# Patient Record
Sex: Female | Born: 1960 | Race: Black or African American | Hispanic: No | Marital: Married | Smoking: Current some day smoker
Health system: Southern US, Community
[De-identification: ages and names within clinical notes are randomized; demographics above are authoritative.]

## PROBLEM LIST (undated history)

## (undated) DIAGNOSIS — E785 Hyperlipidemia, unspecified: Secondary | ICD-10-CM

## (undated) DIAGNOSIS — F410 Panic disorder [episodic paroxysmal anxiety] without agoraphobia: Secondary | ICD-10-CM

## (undated) DIAGNOSIS — E042 Nontoxic multinodular goiter: Secondary | ICD-10-CM

## (undated) DIAGNOSIS — E039 Hypothyroidism, unspecified: Secondary | ICD-10-CM

## (undated) DIAGNOSIS — Z720 Tobacco use: Secondary | ICD-10-CM

## (undated) DIAGNOSIS — I10 Essential (primary) hypertension: Secondary | ICD-10-CM

## (undated) DIAGNOSIS — E079 Disorder of thyroid, unspecified: Secondary | ICD-10-CM

## (undated) DIAGNOSIS — J449 Chronic obstructive pulmonary disease, unspecified: Secondary | ICD-10-CM

## (undated) DIAGNOSIS — E119 Type 2 diabetes mellitus without complications: Secondary | ICD-10-CM

## (undated) HISTORY — PX: TONSILLECTOMY: SUR1361

## (undated) HISTORY — PX: ABDOMINAL HYSTERECTOMY: SHX81

## (undated) HISTORY — PX: COLONOSCOPY: SHX174

---

## 2004-07-23 ENCOUNTER — Emergency Department: Payer: Self-pay | Admitting: Emergency Medicine

## 2005-03-11 ENCOUNTER — Ambulatory Visit: Payer: Self-pay | Admitting: Family Medicine

## 2007-03-03 ENCOUNTER — Ambulatory Visit: Payer: Self-pay | Admitting: Family Medicine

## 2009-08-14 ENCOUNTER — Ambulatory Visit: Payer: Self-pay | Admitting: Unknown Physician Specialty

## 2012-05-18 ENCOUNTER — Ambulatory Visit: Payer: Self-pay | Admitting: Family Medicine

## 2013-05-24 ENCOUNTER — Ambulatory Visit: Payer: Self-pay | Admitting: Family Medicine

## 2013-08-17 ENCOUNTER — Ambulatory Visit: Payer: Self-pay | Admitting: Gastroenterology

## 2013-08-20 LAB — PATHOLOGY REPORT

## 2013-11-15 DIAGNOSIS — I10 Essential (primary) hypertension: Secondary | ICD-10-CM | POA: Insufficient documentation

## 2014-05-31 ENCOUNTER — Ambulatory Visit: Payer: Self-pay | Admitting: Family Medicine

## 2015-03-18 ENCOUNTER — Emergency Department
Admission: EM | Admit: 2015-03-18 | Discharge: 2015-03-18 | Disposition: A | Discharge status: Home / Self Care | Payer: BLUE CROSS/BLUE SHIELD | Attending: Emergency Medicine | Admitting: Emergency Medicine

## 2015-03-18 ENCOUNTER — Emergency Department: Payer: BLUE CROSS/BLUE SHIELD

## 2015-03-18 ENCOUNTER — Encounter: Payer: Self-pay | Admitting: Emergency Medicine

## 2015-03-18 DIAGNOSIS — J45901 Unspecified asthma with (acute) exacerbation: Secondary | ICD-10-CM | POA: Insufficient documentation

## 2015-03-18 DIAGNOSIS — R0602 Shortness of breath: Secondary | ICD-10-CM | POA: Diagnosis present

## 2015-03-18 LAB — BASIC METABOLIC PANEL
ANION GAP: 5 (ref 5–15)
BUN: 14 mg/dL (ref 6–20)
CALCIUM: 9.4 mg/dL (ref 8.9–10.3)
CHLORIDE: 106 mmol/L (ref 101–111)
CO2: 27 mmol/L (ref 22–32)
CREATININE: 0.6 mg/dL (ref 0.44–1.00)
GFR calc non Af Amer: >60 mL/min (ref >60)
GLUCOSE: 147 mg/dL — AB (ref 65–99)
Potassium: 4.2 mmol/L (ref 3.5–5.1)
Sodium: 138 mmol/L (ref 135–145)

## 2015-03-18 LAB — CBC WITH DIFFERENTIAL/PLATELET
BASOS PCT: 1 %
Basophils Absolute: 0.1 10*3/uL (ref 0–0.1)
Eosinophils Absolute: 0.4 10*3/uL (ref 0–0.7)
Eosinophils Relative: 4 %
HEMATOCRIT: 41.1 % (ref 35.0–47.0)
HEMOGLOBIN: 13.7 g/dL (ref 12.0–16.0)
LYMPHS PCT: 52 %
Lymphs Abs: 5.2 10*3/uL — ABNORMAL HIGH (ref 1.0–3.6)
MCH: 31.7 pg (ref 26.0–34.0)
MCHC: 33.2 g/dL (ref 32.0–36.0)
MCV: 95.5 fL (ref 80.0–100.0)
MONO ABS: 0.7 10*3/uL (ref 0.2–0.9)
MONOS PCT: 7 %
NEUTROS ABS: 3.6 10*3/uL (ref 1.4–6.5)
NEUTROS PCT: 36 %
Platelets: 302 10*3/uL (ref 150–440)
RBC: 4.3 MIL/uL (ref 3.80–5.20)
RDW: 14.4 % (ref 11.5–14.5)
WBC: 9.9 10*3/uL (ref 3.6–11.0)

## 2015-03-18 LAB — TROPONIN I: Troponin I: <0.03 ng/mL (ref <0.031)

## 2015-03-18 MED ORDER — ALBUTEROL SULFATE HFA 108 (90 BASE) MCG/ACT IN AERS: 2.0000 | INHALATION_SPRAY | Freq: Four times a day (QID) | RESPIRATORY_TRACT | Status: DC | PRN

## 2015-03-18 MED ORDER — HYDROCOD POLST-CPM POLST ER 10-8 MG/5ML PO SUER: 5.0000 mL | Freq: Two times a day (BID) | ORAL | Status: DC

## 2015-03-18 MED ORDER — METHYLPREDNISOLONE SODIUM SUCC 125 MG IJ SOLR
125.0000 mg | Freq: Once | INTRAMUSCULAR | Status: AC
  Administered 2015-03-18: 125 mg via INTRAVENOUS
  Filled 2015-03-18: qty 2

## 2015-03-18 MED ORDER — ALBUTEROL SULFATE (2.5 MG/3ML) 0.083% IN NEBU
INHALATION_SOLUTION | RESPIRATORY_TRACT | Status: AC
  Administered 2015-03-18: 2.5 mg
  Filled 2015-03-18: qty 6

## 2015-03-18 MED ORDER — ALBUTEROL SULFATE (2.5 MG/3ML) 0.083% IN NEBU
5.0000 mg | INHALATION_SOLUTION | Freq: Once | RESPIRATORY_TRACT | Status: DC
  Filled 2015-03-18: qty 6

## 2015-03-18 MED ORDER — IPRATROPIUM-ALBUTEROL 0.5-2.5 (3) MG/3ML IN SOLN
3.0000 mL | Freq: Once | RESPIRATORY_TRACT | Status: AC
  Administered 2015-03-18: 3 mL via RESPIRATORY_TRACT
  Filled 2015-03-18: qty 3

## 2015-03-18 MED ORDER — METHYLPREDNISOLONE SODIUM SUCC 125 MG IJ SOLR: 125.0000 mg | Freq: Once | INTRAMUSCULAR | Status: DC

## 2015-03-18 MED ORDER — PREDNISONE 50 MG PO TABS: ORAL_TABLET | ORAL | Status: DC

## 2015-03-18 NOTE — ED Notes (Signed)
Patient transported to X-ray via stretcher by rad tech.

## 2015-03-18 NOTE — ED Notes (Signed)
Dr Williams at bedside 

## 2015-03-18 NOTE — ED Notes (Signed)
Pt. returned from XR. 

## 2015-03-18 NOTE — ED Provider Notes (Signed)
[facility] Emergency Department Provider Note     Time seen: ----------------------------------------- 7:54 PM on 03/18/2015 -----------------------------------------    I have reviewed the triage vital signs and the nursing notes.   HISTORY  Chief Complaint Shortness of Breath    HPI Caitlin Waters is a 54 y.o. female who presents ER for shortness of breath the last 2 days. Patient presents with audible wheezing, she reports chills and recent treatment for bronchitis. Patient is had chest x-ray yesterday which was normal. She states she has increasing shortness of breath with exertion and activity. She has not had significant cough.   No past medical history on file.  There are no active problems to display for this patient.   No past surgical history on file.  Allergies Codeine  Social History Social History  Substance Use Topics  . Smoking status: Not on file  . Smokeless tobacco: Not on file  . Alcohol Use: Not on file    Review of Systems Constitutional: Negative for fever. Eyes: Negative for visual changes. ENT: Negative for sore throat. Cardiovascular: Negative for chest pain. Respiratory: Positive for shortness of breath Gastrointestinal: Negative for abdominal pain, vomiting and diarrhea. Genitourinary: Negative for dysuria. Musculoskeletal: Negative for back pain. Skin: Negative for rash. Neurological: Negative for headaches, focal weakness or numbness.  10-point ROS otherwise negative.  ____________________________________________   PHYSICAL EXAM:  VITAL SIGNS: ED Triage Vitals  Enc Vitals Group     BP 03/18/15 1935 162/88 mmHg     Pulse Rate 03/18/15 1935 99     Resp 03/18/15 1935 26     Temp 03/18/15 1935 97.8 F (36.6 C)     Temp Source 03/18/15 1935 Oral     SpO2 03/18/15 1935 97 %     Weight 03/18/15 1935 140 lb (63.504 kg)     Height 03/18/15 1935 5\' 8"  (1.727 m)     Head Cir --      Peak Flow --    Pain Score --      Pain Loc --      Pain Edu? --      Excl. in GC? --     Constitutional: Alert and oriented. Well appearing and in no distress. Eyes: Conjunctivae are normal. PERRL. Normal extraocular movements. ENT   Head: Normocephalic and atraumatic.   Nose: No congestion/rhinnorhea.   Mouth/Throat: Mucous membranes are moist.   Neck: No stridor. Cardiovascular: Normal rate, regular rhythm. Normal and symmetric distal pulses are present in all extremities. No murmurs, rubs, or gallops. Respiratory: Normal respiratory effort without tachypnea, bilateral rhonchi. Gastrointestinal: Soft and nontender. No distention. No abdominal bruits.  Musculoskeletal: Nontender with normal range of motion in all extremities. No joint effusions.  No lower extremity tenderness nor edema. Neurologic:  Normal speech and language. No gross focal neurologic deficits are appreciated. Speech is normal. No gait instability. Skin:  Skin is warm, dry and intact. No rash noted. Psychiatric: Mood and affect are normal. Speech and behavior are normal. Patient exhibits appropriate insight and judgment. ____________________________________________  EKG: Interpreted by me. Normal sinus rhythm with normal axis normal intervals. No evidence of hypertrophy or acute infarction, rate is 97 bpm  ____________________________________________  ED COURSE:  Pertinent labs & imaging results that were available during my care of the patient were reviewed by me and considered in my medical decision making (see chart for details). Patient will receive nebs and steroids, will reevaluate. ____________________________________________    LABS (pertinent positives/negatives)  Labs Reviewed  CBC WITH DIFFERENTIAL/PLATELET - Abnormal; Notable for the following:    Lymphs Abs 5.2 (*)    All other components within normal limits  BASIC METABOLIC PANEL - Abnormal; Notable for the following:    Glucose, Bld 147 (*)     All other components within normal limits  TROPONIN I    RADIOLOGY Images were viewed by me  Chest x-ray  IMPRESSION: No acute cardiopulmonary process seen. ____________________________________________  FINAL ASSESSMENT AND PLAN  Acute asthma exacerbation  Plan: Patient with labs and imaging as dictated above. Patient likely with either URI or whether related to asthma exacerbation. She'll be discharged with steroids and given cough suppressant with close follow-up with her doctor as needed.   Emily Filbert, MD   Emily Filbert, MD 03/18/15 [phone]

## 2015-03-18 NOTE — Discharge Instructions (Signed)
Asthma, Adult Asthma is a recurring condition in which the airways tighten and narrow. Asthma can make it difficult to breathe. It can cause coughing, wheezing, and shortness of breath. Asthma episodes, also called asthma attacks, range from minor to life-threatening. Asthma cannot be cured, but medicines and lifestyle changes can help control it. CAUSES Asthma is believed to be caused by inherited (genetic) and environmental factors, but its exact cause is unknown. Asthma may be triggered by allergens, lung infections, or irritants in the air. Asthma triggers are different for each person. Common triggers include:   Animal dander.  Dust mites.  Cockroaches.  Pollen from trees or grass.  Mold.  Smoke.  Air pollutants such as dust, household cleaners, hair sprays, aerosol sprays, paint fumes, strong chemicals, or strong odors.  Cold air, weather changes, and winds (which increase molds and pollens in the air).  Strong emotional expressions such as crying or laughing hard.  Stress.  Certain medicines (such as aspirin) or types of drugs (such as beta-blockers).  Sulfites in foods and drinks. Foods and drinks that may contain sulfites include dried fruit, potato chips, and sparkling grape juice.  Infections or inflammatory conditions such as the flu, a cold, or an inflammation of the nasal membranes (rhinitis).  Gastroesophageal reflux disease (GERD).  Exercise or strenuous activity. SYMPTOMS Symptoms may occur immediately after asthma is triggered or many hours later. Symptoms include:  Wheezing.  Excessive nighttime or early morning coughing.  Frequent or severe coughing with a common cold.  Chest tightness.  Shortness of breath. DIAGNOSIS  The diagnosis of asthma is made by a review of your medical history and a physical exam. Tests may also be performed. These may include:  Lung function studies. These tests show how much air you breathe in and out.  Allergy  tests.  Imaging tests such as X-rays. TREATMENT  Asthma cannot be cured, but it can usually be controlled. Treatment involves identifying and avoiding your asthma triggers. It also involves medicines. There are 2 classes of medicine used for asthma treatment:   Controller medicines. These prevent asthma symptoms from occurring. They are usually taken every day.  Reliever or rescue medicines. These quickly relieve asthma symptoms. They are used as needed and provide short-term relief. Your health care provider will help you create an asthma action plan. An asthma action plan is a written plan for managing and treating your asthma attacks. It includes a list of your asthma triggers and how they may be avoided. It also includes information on when medicines should be taken and when their dosage should be changed. An action plan may also involve the use of a device called a peak flow meter. A peak flow meter measures how well the lungs are working. It helps you monitor your condition. HOME CARE INSTRUCTIONS   Take medicines only as directed by your health care provider. Speak with your health care provider if you have questions about how or when to take the medicines.  Use a peak flow meter as directed by your health care provider. Record and keep track of readings.  Understand and use the action plan to help minimize or stop an asthma attack without needing to seek medical care.  Control your home environment in the following ways to help prevent asthma attacks:  Do not smoke. Avoid being exposed to secondhand smoke.  Change your heating and air conditioning filter regularly.  Limit your use of fireplaces and wood stoves.  Get rid of pests (such as roaches   and mice) and their droppings.  Throw away plants if you see mold on them.  Clean your floors and dust regularly. Use unscented cleaning products.  Try to have someone else vacuum for you regularly. Stay out of rooms while they are  being vacuumed and for a short while afterward. If you vacuum, use a dust mask from a hardware store, a double-layered or microfilter vacuum cleaner bag, or a vacuum cleaner with a HEPA filter.  Replace carpet with wood, tile, or vinyl flooring. Carpet can trap dander and dust.  Use allergy-proof pillows, mattress covers, and box spring covers.  Wash bed sheets and blankets every week in hot water and dry them in a dryer.  Use blankets that are made of polyester or cotton.  Clean bathrooms and kitchens with bleach. If possible, have someone repaint the walls in these rooms with mold-resistant paint. Keep out of the rooms that are being cleaned and painted.  Wash hands frequently. SEEK MEDICAL CARE IF:   You have wheezing, shortness of breath, or a cough even if taking medicine to prevent attacks.  The colored mucus you cough up (sputum) is thicker than usual.  Your sputum changes from clear or white to yellow, green, gray, or bloody.  You have any problems that may be related to the medicines you are taking (such as a rash, itching, swelling, or trouble breathing).  You are using a reliever medicine more than 2-3 times per week.  Your peak flow is still at 50-79% of your personal best after following your action plan for 1 hour.  You have a fever. SEEK IMMEDIATE MEDICAL CARE IF:   You seem to be getting worse and are unresponsive to treatment during an asthma attack.  You are short of breath even at rest.  You get short of breath when doing very little physical activity.  You have difficulty eating, drinking, or talking due to asthma symptoms.  You develop chest pain.  You develop a fast heartbeat.  You have a bluish color to your lips or fingernails.  You are light-headed, dizzy, or faint.  Your peak flow is less than 50% of your personal best.   This information is not intended to replace advice given to you by your health care provider. Make sure you discuss any  questions you have with your health care provider.   Document Released: 04/22/2005 Document Revised: 01/11/2015 Document Reviewed: 11/19/2012 Elsevier Interactive Patient Education 2016 [employer]  

## 2015-03-18 NOTE — ED Notes (Signed)
Patient with complaint of shortness of breath times two days. Patient with audible wheezes. Patient reports chills.

## 2015-03-18 NOTE — ED Notes (Signed)
Pt in room w/ proventil nebulizing.  Breath sounds tight, pt in mild/moderate distress.  See VS flowsheet.

## 2015-04-25 ENCOUNTER — Other Ambulatory Visit: Payer: Self-pay | Admitting: Family Medicine

## 2015-04-25 DIAGNOSIS — Z1231 Encounter for screening mammogram for malignant neoplasm of breast: Secondary | ICD-10-CM

## 2015-05-05 ENCOUNTER — Emergency Department: Payer: BLUE CROSS/BLUE SHIELD

## 2015-05-05 ENCOUNTER — Encounter: Payer: Self-pay | Admitting: Emergency Medicine

## 2015-05-05 ENCOUNTER — Inpatient Hospital Stay
Admission: EM | Admit: 2015-05-05 | Discharge: 2015-05-05 | DRG: 190 | Disposition: A | Discharge status: Home / Self Care | Payer: BLUE CROSS/BLUE SHIELD | Attending: Internal Medicine | Admitting: Internal Medicine

## 2015-05-05 DIAGNOSIS — Z885 Allergy status to narcotic agent status: Secondary | ICD-10-CM | POA: Diagnosis not present

## 2015-05-05 DIAGNOSIS — I10 Essential (primary) hypertension: Secondary | ICD-10-CM | POA: Diagnosis present

## 2015-05-05 DIAGNOSIS — J9601 Acute respiratory failure with hypoxia: Secondary | ICD-10-CM | POA: Diagnosis present

## 2015-05-05 DIAGNOSIS — Z79899 Other long term (current) drug therapy: Secondary | ICD-10-CM | POA: Diagnosis not present

## 2015-05-05 DIAGNOSIS — E039 Hypothyroidism, unspecified: Secondary | ICD-10-CM | POA: Diagnosis present

## 2015-05-05 DIAGNOSIS — J441 Chronic obstructive pulmonary disease with (acute) exacerbation: Principal | ICD-10-CM | POA: Diagnosis present

## 2015-05-05 HISTORY — DX: Tobacco use: Z72.0

## 2015-05-05 HISTORY — DX: Disorder of thyroid, unspecified: E07.9

## 2015-05-05 HISTORY — DX: Chronic obstructive pulmonary disease, unspecified: J44.9

## 2015-05-05 HISTORY — DX: Essential (primary) hypertension: I10

## 2015-05-05 LAB — CBC
HEMATOCRIT: 40.5 % (ref 35.0–47.0)
Hemoglobin: 13.7 g/dL (ref 12.0–16.0)
MCH: 31.9 pg (ref 26.0–34.0)
MCHC: 33.8 g/dL (ref 32.0–36.0)
MCV: 94.5 fL (ref 80.0–100.0)
PLATELETS: 325 10*3/uL (ref 150–440)
RBC: 4.28 MIL/uL (ref 3.80–5.20)
RDW: 13.7 % (ref 11.5–14.5)
WBC: 12.4 10*3/uL — ABNORMAL HIGH (ref 3.6–11.0)

## 2015-05-05 LAB — BASIC METABOLIC PANEL
Anion gap: 10 (ref 5–15)
BUN: 11 mg/dL (ref 6–20)
CHLORIDE: 102 mmol/L (ref 101–111)
CO2: 28 mmol/L (ref 22–32)
CREATININE: 0.69 mg/dL (ref 0.44–1.00)
Calcium: 9.3 mg/dL (ref 8.9–10.3)
GFR calc Af Amer: >60 mL/min (ref >60)
GFR calc non Af Amer: >60 mL/min (ref >60)
GLUCOSE: 121 mg/dL — AB (ref 65–99)
POTASSIUM: 3.7 mmol/L (ref 3.5–5.1)
SODIUM: 140 mmol/L (ref 135–145)

## 2015-05-05 LAB — EXPECTORATED SPUTUM ASSESSMENT W REFEX TO RESP CULTURE: SPECIAL REQUESTS: NORMAL

## 2015-05-05 LAB — TROPONIN I: Troponin I: <0.03 ng/mL (ref <0.031)

## 2015-05-05 LAB — EXPECTORATED SPUTUM ASSESSMENT W GRAM STAIN, RFLX TO RESP C

## 2015-05-05 MED ORDER — IPRATROPIUM-ALBUTEROL 0.5-2.5 (3) MG/3ML IN SOLN
3.0000 mL | RESPIRATORY_TRACT | Status: DC
  Administered 2015-05-05 (×2): 3 mL via RESPIRATORY_TRACT
  Filled 2015-05-05 (×2): qty 3

## 2015-05-05 MED ORDER — NICOTINE 14 MG/24HR TD PT24
14.0000 mg | MEDICATED_PATCH | Freq: Every day | TRANSDERMAL | Status: DC
Start: 2015-05-05 — End: 2016-01-09

## 2015-05-05 MED ORDER — ACETAMINOPHEN 650 MG RE SUPP: 650.0000 mg | Freq: Four times a day (QID) | RECTAL | Status: DC | PRN

## 2015-05-05 MED ORDER — SODIUM CHLORIDE 0.9 % IJ SOLN
3.0000 mL | Freq: Two times a day (BID) | INTRAMUSCULAR | Status: DC
  Administered 2015-05-05: 3 mL via INTRAVENOUS

## 2015-05-05 MED ORDER — IPRATROPIUM-ALBUTEROL 0.5-2.5 (3) MG/3ML IN SOLN
RESPIRATORY_TRACT | Status: AC
  Administered 2015-05-05: 3 mL via RESPIRATORY_TRACT
  Filled 2015-05-05: qty 3

## 2015-05-05 MED ORDER — ONDANSETRON HCL 4 MG PO TABS: 4.0000 mg | ORAL_TABLET | Freq: Four times a day (QID) | ORAL | Status: DC | PRN

## 2015-05-05 MED ORDER — ALBUTEROL SULFATE (2.5 MG/3ML) 0.083% IN NEBU
5.0000 mg | INHALATION_SOLUTION | Freq: Once | RESPIRATORY_TRACT | Status: AC
  Administered 2015-05-05: 5 mg via RESPIRATORY_TRACT

## 2015-05-05 MED ORDER — LEVOFLOXACIN 750 MG PO TABS
750.0000 mg | ORAL_TABLET | Freq: Every day | ORAL | Status: DC
  Administered 2015-05-05: 750 mg via ORAL
  Filled 2015-05-05: qty 1

## 2015-05-05 MED ORDER — ASPIRIN EC 81 MG PO TBEC
81.0000 mg | DELAYED_RELEASE_TABLET | Freq: Every day | ORAL | Status: DC
  Filled 2015-05-05: qty 1

## 2015-05-05 MED ORDER — METHYLPREDNISOLONE SODIUM SUCC 125 MG IJ SOLR
60.0000 mg | Freq: Every day | INTRAMUSCULAR | Status: DC
  Administered 2015-05-05: 60 mg via INTRAVENOUS
  Filled 2015-05-05: qty 2

## 2015-05-05 MED ORDER — DOCUSATE SODIUM 100 MG PO CAPS
100.0000 mg | ORAL_CAPSULE | Freq: Two times a day (BID) | ORAL | Status: DC
  Administered 2015-05-05: 100 mg via ORAL
  Filled 2015-05-05: qty 1

## 2015-05-05 MED ORDER — PREDNISONE 10 MG PO TABS: 10.0000 mg | ORAL_TABLET | Freq: Every day | ORAL | Status: DC

## 2015-05-05 MED ORDER — ALBUTEROL SULFATE (2.5 MG/3ML) 0.083% IN NEBU
INHALATION_SOLUTION | RESPIRATORY_TRACT | Status: AC
  Administered 2015-05-05: 5 mg via RESPIRATORY_TRACT
  Filled 2015-05-05: qty 6

## 2015-05-05 MED ORDER — BENZONATATE 100 MG PO CAPS
200.0000 mg | ORAL_CAPSULE | Freq: Once | ORAL | Status: AC
  Administered 2015-05-05: 200 mg via ORAL
  Filled 2015-05-05: qty 2

## 2015-05-05 MED ORDER — POLYETHYLENE GLYCOL 3350 17 G PO PACK: 17.0000 g | PACK | Freq: Every day | ORAL | Status: DC | PRN

## 2015-05-05 MED ORDER — LEVOFLOXACIN 750 MG PO TABS: 750.0000 mg | ORAL_TABLET | Freq: Every day | ORAL | Status: DC

## 2015-05-05 MED ORDER — SODIUM CHLORIDE 0.9 % IJ SOLN: 3.0000 mL | INTRAMUSCULAR | Status: DC | PRN

## 2015-05-05 MED ORDER — LOSARTAN POTASSIUM 25 MG PO TABS
25.0000 mg | ORAL_TABLET | Freq: Every day | ORAL | Status: DC
  Administered 2015-05-05: 25 mg via ORAL
  Filled 2015-05-05: qty 1

## 2015-05-05 MED ORDER — SODIUM CHLORIDE 0.9 % IV SOLN: 250.0000 mL | INTRAVENOUS | Status: DC | PRN

## 2015-05-05 MED ORDER — ENOXAPARIN SODIUM 40 MG/0.4ML ~~LOC~~ SOLN: 40.0000 mg | SUBCUTANEOUS | Status: DC

## 2015-05-05 MED ORDER — IPRATROPIUM-ALBUTEROL 0.5-2.5 (3) MG/3ML IN SOLN
3.0000 mL | Freq: Once | RESPIRATORY_TRACT | Status: AC
Start: 2015-05-05 — End: 2015-05-05
  Administered 2015-05-05: 3 mL via RESPIRATORY_TRACT

## 2015-05-05 MED ORDER — ACETAMINOPHEN 325 MG PO TABS: 650.0000 mg | ORAL_TABLET | Freq: Four times a day (QID) | ORAL | Status: DC | PRN

## 2015-05-05 MED ORDER — ONDANSETRON HCL 4 MG/2ML IJ SOLN: 4.0000 mg | Freq: Four times a day (QID) | INTRAMUSCULAR | Status: DC | PRN

## 2015-05-05 MED ORDER — LEVOTHYROXINE SODIUM 100 MCG PO TABS
100.0000 ug | ORAL_TABLET | Freq: Every day | ORAL | Status: DC
Start: 2015-05-05 — End: 2015-05-05
  Administered 2015-05-05: 100 ug via ORAL
  Filled 2015-05-05: qty 1

## 2015-05-05 NOTE — ED Notes (Signed)
Pt placed on 2L o2 via [facility] per dr. Manson PasseyBrown request

## 2015-05-05 NOTE — Progress Notes (Addendum)
Pt is alert and oriented x 4, denies pain, ambulatory in room, on room air, denies SOB, no problems with breathing, denies chest pain and reports she is ready to be discharged,WBC elevated over night, pt is d/c to home, pt will continue on po prednisone and Levaquin, nicotine patch sent into pharmacy- pt reports she is committed to smoking cessation, f/u appt scheduled,  pt reports understanding of d/c instructions and has no further questions at this time. Pt d/c via husband, uneventful shift.

## 2015-05-05 NOTE — ED Notes (Signed)
Pt to rm 11 via EMS from home.  EMS report SOB x 1 day, but problems with SOB since October, pt has seen PCP w/ no clear cause.  Pt received 125 solumedrol and 2 duonebs in route.  Dr. Manson PasseyBrown at bedside.  Pt speaking in short sentences, breathing labored.

## 2015-05-05 NOTE — ED Notes (Signed)
Dr. Brown at bedside to discuss admission

## 2015-05-05 NOTE — Discharge Summary (Signed)
[facility] [place] [facility]   PATIENT NAME: Caitlin SanderWilma Abramovich    MR#:  [id number]  DATE OF BIRTH:  04/12/1961  DATE OF ADMISSION:  05/05/2015 ADMITTING PHYSICIAN: Milagros LollSrikar Sudini, MD  DATE OF DISCHARGE:*05/05/2015 11:12 AM  PRIMARY CARE PHYSICIAN: Marisue IvanLINTHAVONG, KANHKA, MD    ADMISSION DIAGNOSIS:  Breathing difficulty  DISCHARGE DIAGNOSIS:  Active Problems:   COPD exacerbation (HCC)   SECONDARY DIAGNOSIS:   Past Medical History  Diagnosis Date  . Thyroid disease   . HTN (hypertension)   . Tobacco abuse   . COPD (chronic obstructive pulmonary disease) [facility](HCC)     HOSPITAL COURSE:   54 year old female with a history of COPD and hypothyroidism who presented with shortness of breath and wheezing. For further details please refer the H&P.  1. Acute COPD exacerbation: Patient was admitted to the hospital service and treated for COPD with IV steroids and nebulizers. She is improved.   2. Acute hypoxic respiratory failure: This is secondary to problem #1. She was no longer hypoxic at discharge.  3. Essential hypertension: Continue losartan.  4.. Hypothyroid: Continue Synthroid.  5. Tobacco dependence: Patient was counseled on admission. Patient will be discharged with nicotine patch.  DISCHARGE CONDITIONS AND DIET:  Patient is stable for discharge on a heart healthy diet  CONSULTS OBTAINED:     DRUG ALLERGIES:   Allergies  Allergen Reactions  . Codeine Other (See Comments)    Was prescribed in past cough syrup with codeine that made her "feel funny."    DISCHARGE MEDICATIONS:   Discharge Medication List as of 05/05/2015 10:05 AM    START taking these medications   Details  levofloxacin (LEVAQUIN) 750 MG tablet Take 1 tablet (750 mg total) by mouth daily., Starting 05/05/2015, Until Discontinued, Normal    nicotine (NICODERM CQ) 14 mg/24hr patch Place 1 patch (14 mg total) onto the skin daily., Starting 05/05/2015, Until Discontinued,  Normal      CONTINUE these medications which have CHANGED   Details  predniSONE (DELTASONE) 10 MG tablet Take 1 tablet (10 mg total) by mouth daily with breakfast., Starting 05/05/2015, Until Discontinued, Print      CONTINUE these medications which have NOT CHANGED   Details  albuterol (PROVENTIL HFA;VENTOLIN HFA) 108 (90 BASE) MCG/ACT inhaler Inhale 2 puffs into the lungs every 6 (six) hours as needed for wheezing or shortness of breath., Starting 03/18/2015, Until Discontinued, Print    levothyroxine (SYNTHROID, LEVOTHROID) 100 MCG tablet Take 100 mcg by mouth daily before breakfast., Until Discontinued, Historical Med    losartan (COZAAR) 25 MG tablet Take 25 mg by mouth daily., Until Discontinued, Historical Med    chlorpheniramine-HYDROcodone (TUSSIONEX PENNKINETIC ER) 10-8 MG/5ML SUER Take 5 mLs by mouth 2 (two) times daily., Starting 03/18/2015, Until Discontinued, Print              Today   CHIEF COMPLAINT:  Patient is doing well this point. Patient is ready for discharge. She does not like her breakfast.   VITAL SIGNS:  Blood pressure 116/74, pulse 108, temperature 97.7 F (36.5 C), temperature source Oral, resp. rate 22, height 5\' 7"  (1.702 m), weight 63.64 kg (140 lb 4.8 oz), SpO2 96 %.   REVIEW OF SYSTEMS:  Review of Systems  Constitutional: Negative for fever, chills and malaise/fatigue.  HENT: Negative for sore throat.   Eyes: Negative for blurred vision.  Respiratory: Negative for cough, hemoptysis, shortness of breath and wheezing.   Cardiovascular: Negative for chest pain, palpitations and  leg swelling.  Gastrointestinal: Negative for nausea, vomiting, abdominal pain, diarrhea and blood in stool.  Genitourinary: Negative for dysuria.  Musculoskeletal: Negative for back pain.  Neurological: Negative for dizziness, tremors and headaches.  Endo/Heme/Allergies: Does not bruise/bleed easily.     PHYSICAL EXAMINATION:  GENERAL:  54 y.o.-year-old  patient lying in the bed with no acute distress.  NECK:  Supple, no jugular venous distention. No thyroid enlargement, no tenderness.  LUNGS: Normal breath sounds bilaterally, no wheezing, rales,rhonchi  No use of accessory muscles of respiration.  CARDIOVASCULAR: S1, S2 normal. No murmurs, rubs, or gallops.  ABDOMEN: Soft, non-tender, non-distended. Bowel sounds present. No organomegaly or mass.  EXTREMITIES: No pedal edema, cyanosis, or clubbing.  PSYCHIATRIC: The patient is alert and oriented x 3.  SKIN: No obvious rash, lesion, or ulcer.   DATA REVIEW:   CBC  Recent Labs Lab 05/05/15 0101  WBC 12.4*  HGB 13.7  HCT 40.5  PLT 325    Chemistries   Recent Labs Lab 05/05/15 0101  NA 140  K 3.7  CL 102  CO2 28  GLUCOSE 121*  BUN 11  CREATININE 0.69  CALCIUM 9.3    Cardiac Enzymes  Recent Labs Lab 05/05/15 0101  TROPONINI <0.03    Microbiology Results  @  RADIOLOGY:  Dg Chest Port 1 View  05/05/2015  CLINICAL DATA:  54 year old female with cough and shortness of breath EXAM: PORTABLE CHEST 1 VIEW COMPARISON:  Radiograph dated 03/18/2015 FINDINGS: The heart size and mediastinal contours are within normal limits. Both lungs are clear. The visualized skeletal structures are unremarkable. IMPRESSION: No active disease. Electronically Signed   By: Elgie Collard M.D.   On: 05/05/2015 01:29      Management plans discussed with the patient and she is in agreement. Stable for discharge home  Patient should follow up with PCP in one week  CODE STATUS:     Code Status Orders        Start     Ordered   05/05/15 0414  Full code   Continuous     05/05/15 0413      TOTAL TIME TAKING CARE OF THIS PATIENT: 35 minutes.    Note: This dictation was prepared with Dragon dictation along with smaller phrase technology. Any transcriptional errors that result from this process are unintentional.  Keidrick Murty M.D on 05/05/2015 at 12:49 PM  Between  7am to 6pm - Pager - [phone] After 6pm go to [email or web address] - password [email or web address] [facility]  [facility]  Office  [phone]  CC: Primary care physician; Marisue Ivan, MD

## 2015-05-05 NOTE — ED Provider Notes (Signed)
[facility] Emergency Department Provider Note  ____________________________________________  Time seen: 1:00 AM  I have reviewed the triage vital signs and the nursing notes.   HISTORY  Chief Complaint Shortness of Breath      HPI Caitlin Waters is a 54 y.o. female presents via EMS with complaint of dyspnea times one day that has been progressive. Patient states that she's had this since October but acutely worsened yesterday. Patient received 2 DuoNeb's and 125 mg of Solu-Medrol IV by EMS before presentation with improvement of symptoms.    Past Medical History  Diagnosis Date  . Thyroid disease     There are no active problems to display for this patient.   History reviewed. No pertinent past surgical history.  Current Outpatient Rx  Name  Route  Sig  Dispense  Refill  . albuterol (PROVENTIL HFA;VENTOLIN HFA) 108 (90 BASE) MCG/ACT inhaler   Inhalation   Inhale 2 puffs into the lungs every 6 (six) hours as needed for wheezing or shortness of breath.   1 Inhaler   2   . levothyroxine (SYNTHROID, LEVOTHROID) 100 MCG tablet   Oral   Take 100 mcg by mouth daily before breakfast.         . losartan (COZAAR) 25 MG tablet   Oral   Take 25 mg by mouth daily.         . chlorpheniramine-HYDROcodone (TUSSIONEX PENNKINETIC ER) 10-8 MG/5ML SUER   Oral   Take 5 mLs by mouth 2 (two) times daily.   140 mL   0   . predniSONE (DELTASONE) 50 MG tablet      Take one tablet by mouth daily   4 tablet   0     Allergies Codeine  History reviewed. No pertinent family history.  Social History Social History  Substance Use Topics  . Smoking status: Current Every Day Smoker -- 0.10 packs/day    Types: Cigarettes  . Smokeless tobacco: None  . Alcohol Use: Yes    Review of Systems  Constitutional: Negative for fever. Eyes: Negative for visual changes. ENT: Negative for sore throat. Cardiovascular: Negative for chest pain. Respiratory:  Positive for dyspnea Gastrointestinal: Negative for abdominal pain, vomiting and diarrhea. Genitourinary: Negative for dysuria. Musculoskeletal: Negative for back pain. Skin: Negative for rash. Neurological: Negative for headaches, focal weakness or numbness.   10-point ROS otherwise negative.  ____________________________________________   PHYSICAL EXAM:  VITAL SIGNS: ED Triage Vitals  Enc Vitals Group     BP 05/05/15 0055 141/63 mmHg     Pulse Rate 05/05/15 0055 97     Resp 05/05/15 0055 17     Temp 05/05/15 0055 97.5 F (36.4 C)     Temp Source 05/05/15 0055 Oral     SpO2 05/05/15 0055 97 %     Weight 05/05/15 0055 140 lb (63.504 kg)     Height 05/05/15 0055  (1.702 m)     Head Cir --      Peak Flow --      Pain Score 05/05/15 0056 0     Pain Loc --      Pain Edu? --      Excl. in GC? --      Constitutional: Alert and oriented. Apparent distress Eyes: Conjunctivae are normal. PERRL. Normal extraocular movements. ENT   Head: Normocephalic and atraumatic.   Nose: No congestion/rhinnorhea.   Mouth/Throat: Mucous membranes are moist.   Neck: No stridor. Hematological/Lymphatic/Immunilogical: No cervical lymphadenopathy. Cardiovascular: Normal  rate, regular rhythm. Normal and symmetric distal pulses are present in all extremities. No murmurs, rubs, or gallops. Respiratory: Tachypnea with accessory muscle use diffuse rhonchi on auscultation Gastrointestinal: Soft and nontender. No distention. There is no CVA tenderness. Genitourinary: deferred Musculoskeletal: Nontender with normal range of motion in all extremities. No joint effusions.  No lower extremity tenderness nor edema. Neurologic:  Normal speech and language. No gross focal neurologic deficits are appreciated. Speech is normal.  Skin:  Skin is warm, dry and intact. No rash noted. Psychiatric: Mood and affect are normal. Speech and behavior are normal. Patient exhibits appropriate insight and  judgment.  ____________________________________________    LABS (pertinent positives/negatives)  Labs Reviewed  BASIC METABOLIC PANEL - Abnormal; Notable for the following:    Glucose, Bld 121 (*)    All other components within normal limits  CBC - Abnormal; Notable for the following:    WBC 12.4 (*)    All other components within normal limits  TROPONIN I     ____________________________________________   EKG  ED ECG REPORT I, Czar Ysaguirre, [place], the attending physician, personally viewed and interpreted this ECG.   Date: 05/05/2015  EKG Time: 12:58 AM  Rate: 97  Rhythm: Normal sinus rhythm  Axis: None  Intervals: Normal  ST&T Change: None   ____________________________________________    RADIOLOGY    DG Chest Port 1 View (Final result) Result time: 05/05/15 01:29:14   Final result by Rad Results In Interface (05/05/15 01:29:14)   Narrative:   CLINICAL DATA: 54 year old female with cough and shortness of breath  EXAM: PORTABLE CHEST 1 VIEW  COMPARISON: Radiograph dated 03/18/2015  FINDINGS: The heart size and mediastinal contours are within normal limits. Both lungs are clear. The visualized skeletal structures are unremarkable.  IMPRESSION: No active disease.   Electronically Signed By: Elgie CollardArash Radparvar M.D. On: 05/05/2015 01:29        INITIAL IMPRESSION / ASSESSMENT AND PLAN / ED COURSE  Pertinent labs & imaging results that were available during my care of the patient were reviewed by me and considered in my medical decision making (see chart for details).  Patient received 2 DuoNeb's in the emergency department continued difficulty breathing and cough  ____________________________________________   FINAL CLINICAL IMPRESSION(S) / ED DIAGNOSES  Final diagnoses:  COPD exacerbation (HCC)      Darci Currentandolph N Jillyan Plitt, MD 05/07/15 [phone]

## 2015-05-05 NOTE — H&P (Signed)
[facility] [place] [facility]   PATIENT NAME: Caitlin SanderWilma Boyar    MR#:  [id number]  DATE OF BIRTH:  May 05, 1961  DATE OF ADMISSION:  05/05/2015  PRIMARY CARE PHYSICIAN: Marisue IvanLINTHAVONG, KANHKA, MD   REQUESTING/REFERRING PHYSICIAN: Dr. Manson PasseyBrown  CHIEF COMPLAINT:   Chief Complaint  Patient presents with  . Shortness of Breath    HISTORY OF PRESENT ILLNESS:  Caitlin Waters  is a 54 y.o. female with a known history of hypertension, hypothyroidism, tobacco abuse presents to the emergency room complaining of worsening shortness of breath and cough with wheezing. Her symptoms started 2 months back with on and off worsening. Was seen in the emergency room 1 month prior and treated with prednisone and albuterol inhaler with good symptoms improved. Symptoms started worsening again a few days prior with acute worsening today prompting her to come to the emergency room. Patient has had long-standing history of shortness of breath with exertion. She has 20-pack-year smoking history and likely underlying COPD. Continues to smoke in spite of her symptoms. No sick contacts. No orthopnea or edema.  PAST MEDICAL HISTORY:   Past Medical History  Diagnosis Date  . Thyroid disease   . HTN (hypertension)   . Tobacco abuse   . COPD (chronic obstructive pulmonary disease) (HCC)     PAST SURGICAL HISTORY:  History reviewed. No pertinent past surgical history.  SOCIAL HISTORY:   Social History  Substance Use Topics  . Smoking status: Current Every Day Smoker -- 0.10 packs/day    Types: Cigarettes  . Smokeless tobacco: Not on file  . Alcohol Use: 0.0 oz/week    0 Standard drinks or equivalent per week    FAMILY HISTORY:  History reviewed. No pertinent family history.  DRUG ALLERGIES:   Allergies  Allergen Reactions  . Codeine Other (See Comments)    Was prescribed in past cough syrup with codeine that made her "feel funny."    REVIEW OF SYSTEMS:   Review of Systems   Constitutional: Positive for malaise/fatigue. Negative for fever, chills and weight loss.  HENT: Negative for hearing loss and nosebleeds.   Eyes: Negative for blurred vision, double vision and pain.  Respiratory: Positive for cough, sputum production and shortness of breath. Negative for hemoptysis and wheezing.   Cardiovascular: Negative for chest pain, palpitations, orthopnea and leg swelling.  Gastrointestinal: Negative for nausea, vomiting, abdominal pain, diarrhea and constipation.  Genitourinary: Negative for dysuria and hematuria.  Musculoskeletal: Positive for myalgias. Negative for back pain and falls.  Skin: Negative for rash.  Neurological: Positive for weakness. Negative for dizziness, tremors, sensory change, speech change, focal weakness, seizures and headaches.  Endo/Heme/Allergies: Does not bruise/bleed easily.  Psychiatric/Behavioral: Negative for depression and memory loss. The patient is not nervous/anxious.     MEDICATIONS AT HOME:   Prior to Admission medications   Medication Sig Start Date End Date Taking? Authorizing Provider  albuterol (PROVENTIL HFA;VENTOLIN HFA) 108 (90 BASE) MCG/ACT inhaler Inhale 2 puffs into the lungs every 6 (six) hours as needed for wheezing or shortness of breath. 03/18/15  Yes Emily FilbertJonathan E Williams, MD  levothyroxine (SYNTHROID, LEVOTHROID) 100 MCG tablet Take 100 mcg by mouth daily before breakfast.   Yes Historical Provider, MD  losartan (COZAAR) 25 MG tablet Take 25 mg by mouth daily.   Yes Historical Provider, MD  chlorpheniramine-HYDROcodone (TUSSIONEX PENNKINETIC ER) 10-8 MG/5ML SUER Take 5 mLs by mouth 2 (two) times daily. 03/18/15   Emily FilbertJonathan E Williams, MD  predniSONE (DELTASONE) 50 MG  tablet Take one tablet by mouth daily 03/18/15   Emily Filbert, MD      VITAL SIGNS:  Blood pressure 130/74, pulse 95, temperature 97.5 F (36.4 C), temperature source Oral, resp. rate 17, height  (1.702 m), weight 63.504 kg (140 lb),  SpO2 94 %.  PHYSICAL EXAMINATION:  Physical Exam  GENERAL:  54 y.o.-year-old patient lying in the bed with respiratory distress.  EYES: Pupils equal, round, reactive to light and accommodation. No scleral icterus. Extraocular muscles intact.  HEENT: Head atraumatic, normocephalic. Oropharynx and nasopharynx clear. No oropharyngeal erythema, moist oral mucosa  NECK:  Supple, no jugular venous distention. No thyroid enlargement, no tenderness.  LUNGS: Increased work of breathing with poor entry bilaterally. Expiratory wheezes. Positional dyspnea. CARDIOVASCULAR: S1, S2 normal. No murmurs, rubs, or gallops.  ABDOMEN: Soft, nontender, nondistended. Bowel sounds present. No organomegaly or mass.  EXTREMITIES: No pedal edema, cyanosis, or clubbing. + 2 pedal & radial pulses b/l.   NEUROLOGIC: Cranial nerves II through XII are intact. No focal Motor or sensory deficits appreciated b/l PSYCHIATRIC: The patient is alert and oriented x 3. Good affect.  SKIN: No obvious rash, lesion, or ulcer.   LABORATORY PANEL:   CBC  Recent Labs Lab 05/05/15 0101  WBC 12.4*  HGB 13.7  HCT 40.5  PLT 325   ------------------------------------------------------------------------------------------------------------------  Chemistries   Recent Labs Lab 05/05/15 0101  NA 140  K 3.7  CL 102  CO2 28  GLUCOSE 121*  BUN 11  CREATININE 0.69  CALCIUM 9.3   ------------------------------------------------------------------------------------------------------------------  Cardiac Enzymes  Recent Labs Lab 05/05/15 0101  TROPONINI <0.03   ------------------------------------------------------------------------------------------------------------------  RADIOLOGY:  Dg Chest Port 1 View  05/05/2015  CLINICAL DATA:  54 year old female with cough and shortness of breath EXAM: PORTABLE CHEST 1 VIEW COMPARISON:  Radiograph dated 03/18/2015 FINDINGS: The heart size and mediastinal contours are within  normal limits. Both lungs are clear. The visualized skeletal structures are unremarkable. IMPRESSION: No active disease. Electronically Signed   By: Elgie Collard M.D.   On: 05/05/2015 01:29     IMPRESSION AND PLAN:   * COPD exacerbation -IV steroids, Antibiotics - Scheduled Nebulizers - Inhalers -Wean O2 as tolerated - Consult pulmonary if no improvement  * Acute hypoxic resp failure due to above Wean O2 as tolerated. ABG pending  * HTN Continue loasartan  * Hypothyroidism In the new levothyroxine  * Tobacco abuse Counseled to quit smoking for greater than 3 minutes.  * DVT prophylaxis with Lovenox   All the records are reviewed and case discussed with ED provider. Management plans discussed with the patient, family and they are in agreement.  CODE STATUS: FULL  TOTAL TIME TAKING CARE OF THIS PATIENT: 40 minutes.    Milagros Loll R M.D on 05/05/2015 at 4:15 AM  Between 7am to 6pm - Pager - [phone]  After 6pm go to [email or web address] - password [email or web address] [facility]  [facility]  Office  [phone]  CC: Primary care physician; Marisue Ivan, MD   Note: This dictation was prepared with Dragon dictation along with smaller phrase technology. Any transcriptional errors that result from this process are unintentional.

## 2015-05-05 NOTE — Progress Notes (Signed)
[facility] [place] [facility]        Caitlin Waters was admitted to the Hospital on 05/05/2015 and Discharged  05/05/2015 and should be excused from work/school   for 1  days starting 05/05/2015 , may return to work/school without any restrictions.  Call Adrian SaranSital Wasif Simonich MD with questions.  [name] M.D on 05/05/2015,at 9:58 AM  [facility] [place] [facility]    Office  [phone]

## 2015-05-08 LAB — CULTURE, RESPIRATORY: SPECIAL REQUESTS: NORMAL

## 2015-05-08 LAB — CULTURE, RESPIRATORY W GRAM STAIN

## 2015-06-05 ENCOUNTER — Ambulatory Visit
Admission: RE | Admit: 2015-06-05 | Discharge: 2015-06-05 | Disposition: A | Discharge status: Home / Self Care | Payer: BLUE CROSS/BLUE SHIELD | Source: Ambulatory Visit | Attending: Family Medicine | Admitting: Family Medicine

## 2015-06-05 DIAGNOSIS — Z1231 Encounter for screening mammogram for malignant neoplasm of breast: Secondary | ICD-10-CM | POA: Insufficient documentation

## 2015-07-04 ENCOUNTER — Other Ambulatory Visit: Payer: Self-pay | Admitting: Family Medicine

## 2015-07-04 DIAGNOSIS — Z1231 Encounter for screening mammogram for malignant neoplasm of breast: Secondary | ICD-10-CM

## 2015-07-04 DIAGNOSIS — Z7185 Encounter for immunization safety counseling: Secondary | ICD-10-CM | POA: Insufficient documentation

## 2015-07-06 ENCOUNTER — Emergency Department
Admission: EM | Admit: 2015-07-06 | Discharge: 2015-07-06 | Disposition: A | Discharge status: Home / Self Care | Payer: BLUE CROSS/BLUE SHIELD | Attending: Emergency Medicine | Admitting: Emergency Medicine

## 2015-07-06 ENCOUNTER — Emergency Department: Payer: BLUE CROSS/BLUE SHIELD

## 2015-07-06 ENCOUNTER — Encounter: Payer: Self-pay | Admitting: Emergency Medicine

## 2015-07-06 DIAGNOSIS — M549 Dorsalgia, unspecified: Secondary | ICD-10-CM | POA: Diagnosis not present

## 2015-07-06 DIAGNOSIS — I10 Essential (primary) hypertension: Secondary | ICD-10-CM | POA: Insufficient documentation

## 2015-07-06 DIAGNOSIS — R0602 Shortness of breath: Secondary | ICD-10-CM | POA: Diagnosis present

## 2015-07-06 DIAGNOSIS — F17210 Nicotine dependence, cigarettes, uncomplicated: Secondary | ICD-10-CM | POA: Diagnosis not present

## 2015-07-06 DIAGNOSIS — J440 Chronic obstructive pulmonary disease with acute lower respiratory infection: Secondary | ICD-10-CM | POA: Insufficient documentation

## 2015-07-06 DIAGNOSIS — R000 Tachycardia, unspecified: Secondary | ICD-10-CM | POA: Diagnosis not present

## 2015-07-06 DIAGNOSIS — Z792 Long term (current) use of antibiotics: Secondary | ICD-10-CM | POA: Diagnosis not present

## 2015-07-06 DIAGNOSIS — Z79899 Other long term (current) drug therapy: Secondary | ICD-10-CM | POA: Diagnosis not present

## 2015-07-06 DIAGNOSIS — J209 Acute bronchitis, unspecified: Secondary | ICD-10-CM

## 2015-07-06 LAB — TROPONIN I: Troponin I: <0.03 ng/mL (ref <0.031)

## 2015-07-06 LAB — CBC
HEMATOCRIT: 40.9 % (ref 35.0–47.0)
Hemoglobin: 13.9 g/dL (ref 12.0–16.0)
MCH: 32.5 pg (ref 26.0–34.0)
MCHC: 34 g/dL (ref 32.0–36.0)
MCV: 95.8 fL (ref 80.0–100.0)
Platelets: 257 10*3/uL (ref 150–440)
RBC: 4.27 MIL/uL (ref 3.80–5.20)
RDW: 14.6 % — AB (ref 11.5–14.5)
WBC: 8.1 10*3/uL (ref 3.6–11.0)

## 2015-07-06 LAB — BASIC METABOLIC PANEL
Anion gap: 11 (ref 5–15)
BUN: 16 mg/dL (ref 6–20)
CALCIUM: 9.6 mg/dL (ref 8.9–10.3)
CHLORIDE: 105 mmol/L (ref 101–111)
CO2: 24 mmol/L (ref 22–32)
CREATININE: 0.62 mg/dL (ref 0.44–1.00)
GFR calc Af Amer: >60 mL/min (ref >60)
GFR calc non Af Amer: >60 mL/min (ref >60)
GLUCOSE: 110 mg/dL — AB (ref 65–99)
Potassium: 4.4 mmol/L (ref 3.5–5.1)
Sodium: 140 mmol/L (ref 135–145)

## 2015-07-06 MED ORDER — IPRATROPIUM-ALBUTEROL 0.5-2.5 (3) MG/3ML IN SOLN
3.0000 mL | Freq: Once | RESPIRATORY_TRACT | Status: AC
  Administered 2015-07-06: 3 mL via RESPIRATORY_TRACT
  Filled 2015-07-06: qty 6

## 2015-07-06 MED ORDER — IPRATROPIUM-ALBUTEROL 0.5-2.5 (3) MG/3ML IN SOLN
3.0000 mL | Freq: Once | RESPIRATORY_TRACT | Status: DC
Start: 2015-07-06 — End: 2015-07-06
  Filled 2015-07-06: qty 3

## 2015-07-06 MED ORDER — ALBUTEROL SULFATE HFA 108 (90 BASE) MCG/ACT IN AERS: INHALATION_SPRAY | RESPIRATORY_TRACT | Status: DC

## 2015-07-06 MED ORDER — PREDNISONE 20 MG PO TABS
60.0000 mg | ORAL_TABLET | ORAL | Status: AC
  Administered 2015-07-06: 60 mg via ORAL
  Filled 2015-07-06: qty 3

## 2015-07-06 MED ORDER — IPRATROPIUM-ALBUTEROL 0.5-2.5 (3) MG/3ML IN SOLN
3.0000 mL | Freq: Once | RESPIRATORY_TRACT | Status: AC
  Administered 2015-07-06: 3 mL via RESPIRATORY_TRACT
  Filled 2015-07-06: qty 3

## 2015-07-06 MED ORDER — PREDNISONE 20 MG PO TABS: 60.0000 mg | ORAL_TABLET | Freq: Every day | ORAL | Status: DC

## 2015-07-06 MED ORDER — IPRATROPIUM-ALBUTEROL 0.5-2.5 (3) MG/3ML IN SOLN
RESPIRATORY_TRACT | Status: AC
  Administered 2015-07-06: 3 mL via RESPIRATORY_TRACT
  Filled 2015-07-06: qty 3

## 2015-07-06 MED ORDER — IPRATROPIUM-ALBUTEROL 0.5-2.5 (3) MG/3ML IN SOLN
3.0000 mL | Freq: Once | RESPIRATORY_TRACT | Status: AC
  Administered 2015-07-06: 3 mL via RESPIRATORY_TRACT

## 2015-07-06 NOTE — ED Provider Notes (Signed)
[facility] Emergency Department Provider Note  ____________________________________________  Time seen: Approximately 7:16 PM  I have reviewed the triage vital signs and the nursing notes.   HISTORY  Chief Complaint Shortness of Breath    HPI Caitlin Waters is a 55 y.o. female with a history of chronic bronchitis (she insists that she does not have COPD, but she has a lifelong smoking history and frequently gets bronchitis) which is required admission in the past who presents with shortness of breath.  It has been present for the last couple days along with a mild productive cough, but it got acutely worse today.  She saw her PCP 2 days ago and he diagnosed her with a viral bronchitis.  He did not start steroids but he gave her a cough medicine that she has not had before.  She reports that itmade her feel funny time she took it and then today when she took it she became even more short of breath and started having some pain in her back as well.  That has resolved but she still is short of breath and wheezing with an occasional cough.  She describes her symptoms are moderate.  Nothing is making them better and nothing is making them worse.  She denies chest pain, abdominal pain, nausea, vomiting, diarrhea, dysuria, headache.   Past Medical History  Diagnosis Date  . Thyroid disease   . HTN (hypertension)   . Tobacco abuse   . COPD (chronic obstructive pulmonary disease) [facility])     Patient Active Problem List   Diagnosis Date Noted  . COPD exacerbation (HCC) 05/05/2015    Past Surgical History  Procedure Laterality Date  . Abdominal hysterectomy      Current Outpatient Rx  Name  Route  Sig  Dispense  Refill  . albuterol (PROVENTIL HFA;VENTOLIN HFA) 108 (90 Base) MCG/ACT inhaler      Inhale 2-4 puffs by mouth every 4 hours as needed for wheezing, cough, and/or shortness of breath   1 Inhaler   1   . chlorpheniramine-HYDROcodone (TUSSIONEX PENNKINETIC  ER) 10-8 MG/5ML SUER   Oral   Take 5 mLs by mouth 2 (two) times daily.   140 mL   0   . levofloxacin (LEVAQUIN) 750 MG tablet   Oral   Take 1 tablet (750 mg total) by mouth daily.   4 tablet   0   . levothyroxine (SYNTHROID, LEVOTHROID) 100 MCG tablet   Oral   Take 100 mcg by mouth daily before breakfast.         . losartan (COZAAR) 25 MG tablet   Oral   Take 25 mg by mouth daily.         . nicotine (NICODERM CQ) 14 mg/24hr patch   Transdermal   Place 1 patch (14 mg total) onto the skin daily.   28 patch   0   . predniSONE (DELTASONE) 20 MG tablet   Oral   Take 3 tablets (60 mg total) by mouth daily.   15 tablet   0     Allergies Codeine  Family History  Problem Relation Age of Onset  . Breast cancer Neg Hx     Social History Social History  Substance Use Topics  . Smoking status: Current Every Day Smoker -- 0.10 packs/day    Types: Cigarettes  . Smokeless tobacco: None  . Alcohol Use: 0.0 oz/week    0 Standard drinks or equivalent per week    Review of Systems  Constitutional: No fever/chills Eyes: No visual changes. ENT: No sore throat. Cardiovascular: Denies chest pain. Respiratory: Worsening shortness of breath and cough over the last several days similar to prior bronchitis Gastrointestinal: No abdominal pain.  No nausea, no vomiting.  No diarrhea.  No constipation. Genitourinary: Negative for dysuria. Musculoskeletal: Some pain in her back after which she believes was a reaction to the cough medicine Skin: Negative for rash. Neurological: Negative for headaches, focal weakness or numbness.  10-point ROS otherwise negative.  ____________________________________________   PHYSICAL EXAM:  VITAL SIGNS: ED Triage Vitals  Enc Vitals Group     BP 07/06/15 1905 144/85 mmHg     Pulse Rate 07/06/15 1905 102     Resp 07/06/15 1905 22     Temp --      Temp src --      SpO2 07/06/15 1905 95 %     Weight --      Height --      Head Cir --       Peak Flow --      Pain Score 07/06/15 1855 7     Pain Loc --      Pain Edu? --      Excl. in GC? --     Constitutional: Alert and oriented. Well appearing and in no acute distress. Eyes: Conjunctivae are normal. PERRL. EOMI. Head: Atraumatic. Nose: No congestion/rhinnorhea. Mouth/Throat: Mucous membranes are moist.  Oropharynx non-erythematous. Neck: No stridor.   Cardiovascular: Borderline tachycardia, regular rhythm. Grossly normal heart sounds.  Good peripheral circulation. Respiratory: Slightly increased respiratory rate and course lung sounds throughout with strong expiratory wheezing in each quadrant.  The patient sounds tight. Gastrointestinal: Soft and nontender. No distention. No abdominal bruits. No CVA tenderness. Musculoskeletal: No lower extremity tenderness nor edema.  No joint effusions. Neurologic:  Normal speech and language. No gross focal neurologic deficits are appreciated.  Skin:  Skin is warm, dry and intact. No rash noted. Psychiatric: Mood and affect are normal. Speech and behavior are normal.  ____________________________________________   LABS (all labs ordered are listed, but only abnormal results are displayed)  Labs Reviewed  BASIC METABOLIC PANEL - Abnormal; Notable for the following:    Glucose, Bld 110 (*)    All other components within normal limits  CBC - Abnormal; Notable for the following:    RDW 14.6 (*)    All other components within normal limits  TROPONIN I   ____________________________________________  EKG  ED ECG REPORT I, Breland Trouten, the attending physician, personally viewed and interpreted this ECG.  Date: 07/06/2015 EKG Time: 18:44 Rate: 83 Rhythm: normal sinus rhythm QRS Axis: normal Intervals: normal ST/T Wave abnormalities: normal Conduction Disturbances: none Narrative Interpretation: unremarkable  ____________________________________________  RADIOLOGY  Loleta Rose, personally viewed and evaluated  these images (plain radiographs) as part of my medical decision making, as well as reviewing the written report by the radiologist.**}  Dg Chest 2 View  07/06/2015  CLINICAL DATA:  Acute shortness of breath and cough. EXAM: CHEST  2 VIEW COMPARISON:  05/05/2015 and prior radiographs FINDINGS: The cardiomediastinal silhouette is unremarkable. COPD changes identified. There is no evidence of focal airspace disease, pulmonary edema, suspicious pulmonary nodule/mass, pleural effusion, or pneumothorax. No acute bony abnormalities are identified. IMPRESSION: COPD without evidence of acute cardiopulmonary disease. Electronically Signed   By: Harmon Pier M.D.   On: 07/06/2015 19:59    ____________________________________________   PROCEDURES  Procedure(s) performed: None  Critical Care performed: No ____________________________________________  INITIAL IMPRESSION / ASSESSMENT AND PLAN / ED COURSE  Pertinent labs & imaging results that were available during my care of the patient were reviewed by me and considered in my medical decision making (see chart for details).  Signs and symptoms are consistent with acute bronchitis (most likely COPD but the patient insists that she does not have COPD).  I will treat her with 3 DuoNeb labs and prednisone and reassess.  I have low suspicion for any other emergent medical condition at this time.  The patient is well-appearing and in no acute distress other than mild tachypnea.  ----------------------------------------- 9:31 PM on 07/06/2015 -----------------------------------------  Patient sounds much better after 3 DuoNeb some prednisone although she continues to have some expiratory wheezing.  States she is ready to go home.  Prednisone for 5 days, has an inhaler at home.    ____________________________________________  FINAL CLINICAL IMPRESSION(S) / ED DIAGNOSES  Final diagnoses:  Acute bronchitis, unspecified organism      NEW MEDICATIONS  STARTED DURING THIS VISIT:  New Prescriptions   ALBUTEROL (PROVENTIL HFA;VENTOLIN HFA) 108 (90 BASE) MCG/ACT INHALER    Inhale 2-4 puffs by mouth every 4 hours as needed for wheezing, cough, and/or shortness of breath   PREDNISONE (DELTASONE) 20 MG TABLET    Take 3 tablets (60 mg total) by mouth daily.      Note:  This document was prepared using Dragon voice recognition software and may include unintentional dictation errors.   Loleta Rose, MD 07/06/15 2137

## 2015-07-06 NOTE — Discharge Instructions (Signed)
We believe that your symptoms are caused today by an exacerbation of your chronic bronchitis.  Please take the prescribed medications and any medications that you have at home.  Follow up with your doctor as recommended.  We agree with your primary care doctor that you do not need antibiotics at this time.  If you develop any new or worsening symptoms, including but not limited to fever, persistent vomiting, worsening shortness of breath, or other symptoms that concern you, please return to the Emergency Department immediately.   Acute Bronchitis Bronchitis is inflammation of the airways that extend from the windpipe into the lungs (bronchi). The inflammation often causes mucus to develop. This leads to a cough, which is the most common symptom of bronchitis.  In acute bronchitis, the condition usually develops suddenly and goes away over time, usually in a couple weeks. Smoking, allergies, and asthma can make bronchitis worse. Repeated episodes of bronchitis may cause further lung problems.  CAUSES Acute bronchitis is most often caused by the same virus that causes a cold. The virus can spread from person to person (contagious) through coughing, sneezing, and touching contaminated objects. SIGNS AND SYMPTOMS   Cough.   Fever.   Coughing up mucus.   Body aches.   Chest congestion.   Chills.   Shortness of breath.   Sore throat.  DIAGNOSIS  Acute bronchitis is usually diagnosed through a physical exam. Your health care provider will also ask you questions about your medical history. Tests, such as chest X-rays, are sometimes done to rule out other conditions.  TREATMENT  Acute bronchitis usually goes away in a couple weeks. Oftentimes, no medical treatment is necessary. Medicines are sometimes given for relief of fever or cough. Antibiotic medicines are usually not needed but may be prescribed in certain situations. In some cases, an inhaler may be recommended to help reduce  shortness of breath and control the cough. A cool mist vaporizer may also be used to help thin bronchial secretions and make it easier to clear the chest.  HOME CARE INSTRUCTIONS  Get plenty of rest.   Drink enough fluids to keep your urine clear or pale yellow (unless you have a medical condition that requires fluid restriction). Increasing fluids may help thin your respiratory secretions (sputum) and reduce chest congestion, and it will prevent dehydration.   Take medicines only as directed by your health care provider.  If you were prescribed an antibiotic medicine, finish it all even if you start to feel better.  Avoid smoking and secondhand smoke. Exposure to cigarette smoke or irritating chemicals will make bronchitis worse. If you are a smoker, consider using nicotine gum or skin patches to help control withdrawal symptoms. Quitting smoking will help your lungs heal faster.   Reduce the chances of another bout of acute bronchitis by washing your hands frequently, avoiding people with cold symptoms, and trying not to touch your hands to your mouth, nose, or eyes.   Keep all follow-up visits as directed by your health care provider.  SEEK MEDICAL CARE IF: Your symptoms do not improve after 1 week of treatment.  SEEK IMMEDIATE MEDICAL CARE IF:  You develop an increased fever or chills.   You have chest pain.   You have severe shortness of breath.  You have bloody sputum.   You develop dehydration.  You faint or repeatedly feel like you are going to pass out.  You develop repeated vomiting.  You develop a severe headache. MAKE SURE YOU:   Understand  these instructions.  Will watch your condition.  Will get help right away if you are not doing well or get worse.   This information is not intended to replace advice given to you by your health care provider. Make sure you discuss any questions you have with your health care provider.   Document Released:  05/30/2004 Document Revised: 05/13/2014 Document Reviewed: 10/13/2012 Elsevier Interactive Patient Education [employer]

## 2015-07-06 NOTE — ED Notes (Addendum)
  Was seen by her pcp on Tuesday  He dx'd with bronchitis   Was given rx cough med.Rochele Pages a dose last pm felt funny after taking dose of cough meds   Now feels like she can't catch her breath and having some discomfort to back of neck. Also states pain is coming from top of her head into neck

## 2015-07-13 ENCOUNTER — Ambulatory Visit: Payer: BLUE CROSS/BLUE SHIELD

## 2015-12-10 ENCOUNTER — Emergency Department: Payer: BLUE CROSS/BLUE SHIELD

## 2015-12-10 ENCOUNTER — Emergency Department
Admission: EM | Admit: 2015-12-10 | Discharge: 2015-12-10 | Disposition: A | Discharge status: Home / Self Care | Payer: BLUE CROSS/BLUE SHIELD | Attending: Emergency Medicine | Admitting: Emergency Medicine

## 2015-12-10 DIAGNOSIS — J45901 Unspecified asthma with (acute) exacerbation: Secondary | ICD-10-CM | POA: Insufficient documentation

## 2015-12-10 DIAGNOSIS — F17210 Nicotine dependence, cigarettes, uncomplicated: Secondary | ICD-10-CM | POA: Diagnosis not present

## 2015-12-10 DIAGNOSIS — I10 Essential (primary) hypertension: Secondary | ICD-10-CM | POA: Insufficient documentation

## 2015-12-10 DIAGNOSIS — R0602 Shortness of breath: Secondary | ICD-10-CM | POA: Diagnosis present

## 2015-12-10 MED ORDER — AZITHROMYCIN 250 MG PO TABS: ORAL_TABLET | ORAL | 0 refills | Status: AC

## 2015-12-10 MED ORDER — IPRATROPIUM-ALBUTEROL 0.5-2.5 (3) MG/3ML IN SOLN
9.0000 mL | Freq: Once | RESPIRATORY_TRACT | Status: AC
  Administered 2015-12-10: 9 mL via RESPIRATORY_TRACT
  Filled 2015-12-10: qty 9

## 2015-12-10 MED ORDER — PREDNISONE 20 MG PO TABS
60.0000 mg | ORAL_TABLET | Freq: Once | ORAL | Status: AC
  Administered 2015-12-10: 60 mg via ORAL
  Filled 2015-12-10: qty 3

## 2015-12-10 MED ORDER — PREDNISONE 20 MG PO TABS
60.0000 mg | ORAL_TABLET | Freq: Every day | ORAL | 0 refills | Status: DC
Start: 2015-12-10 — End: 2016-01-09

## 2015-12-10 MED ORDER — AZITHROMYCIN 500 MG PO TABS
500.0000 mg | ORAL_TABLET | Freq: Once | ORAL | Status: AC
  Administered 2015-12-10: 500 mg via ORAL
  Filled 2015-12-10: qty 1

## 2015-12-10 MED ORDER — ALBUTEROL SULFATE (2.5 MG/3ML) 0.083% IN NEBU
5.0000 mg | INHALATION_SOLUTION | Freq: Once | RESPIRATORY_TRACT | Status: AC
  Administered 2015-12-10: 5 mg via RESPIRATORY_TRACT
  Filled 2015-12-10: qty 6

## 2015-12-10 NOTE — ED Notes (Signed)
Discharge instructions reviewed with patient. Patient verbalized understanding. Patient ambulated to lobby without difficulty.   

## 2015-12-10 NOTE — ED Triage Notes (Signed)
Pt reports worsening SOB over past 3-4 days. Productive yellow sputum denies pain or fever

## 2015-12-10 NOTE — ED Provider Notes (Signed)
[facility] Emergency Department Provider Note   ____________________________________________   First MD Initiated Contact with Patient 12/10/15 1633     (approximate)  I have reviewed the triage vital signs and the nursing notes.   HISTORY  Chief Complaint Shortness of Breath    HPI Caitlin Waters is a 55 y.o. female with a history of asthma as well as hypertensionpresenting to the emergency department today with 3-4 days worsening shortness of breath. Says that she is also coughing up sputum. Denying any fevers. Denies any pain. Says that this feels like her typical asthma. Has been using her inhaler so much that she ran out of her inhaler puffs. Also complaining of a runny nose. Denies any ear pressure. Says still smokes 1 pack of cigarettes per week.  No home O2.     Past Medical History:  Diagnosis Date  . COPD (chronic obstructive pulmonary disease) (HCC)   . HTN (hypertension)   . Thyroid disease   . Tobacco abuse     Patient Active Problem List   Diagnosis Date Noted  . COPD exacerbation (HCC) 05/05/2015    Past Surgical History:  Procedure Laterality Date  . ABDOMINAL HYSTERECTOMY      Prior to Admission medications   Medication Sig Start Date End Date Taking? Authorizing Provider  albuterol (PROVENTIL HFA;VENTOLIN HFA) 108 (90 Base) MCG/ACT inhaler Inhale 2-4 puffs by mouth every 4 hours as needed for wheezing, cough, and/or shortness of breath 07/06/15   Loleta Rose, MD  chlorpheniramine-HYDROcodone [facility] PENNKINETIC ER) 10-8 MG/5ML SUER Take 5 mLs by mouth 2 (two) times daily. 03/18/15   Emily Filbert, MD  levofloxacin (LEVAQUIN) 750 MG tablet Take 1 tablet (750 mg total) by mouth daily. 05/05/15   [name], MD  levothyroxine (SYNTHROID, LEVOTHROID) 100 MCG tablet Take 100 mcg by mouth daily before breakfast.    Historical Provider, MD  losartan (COZAAR) 25 MG tablet Take 25 mg by mouth daily.    Historical Provider, MD    nicotine (NICODERM CQ) 14 mg/24hr patch Place 1 patch (14 mg total) onto the skin daily. 05/05/15   [name], MD  predniSONE (DELTASONE) 20 MG tablet Take 3 tablets (60 mg total) by mouth daily. 07/06/15   Loleta Rose, MD    Allergies Codeine  Family History  Problem Relation Age of Onset  . Breast cancer Neg Hx     Social History Social History  Substance Use Topics  . Smoking status: Current Every Day Smoker    Packs/day: 0.10    Types: Cigarettes  . Smokeless tobacco: Not on file  . Alcohol use 0.0 oz/week    Review of Systems Constitutional: No fever/chills Eyes: No visual changes. ENT: No sore throat. Cardiovascular: Denies chest pain. Respiratory: As above Gastrointestinal: No abdominal pain.  No nausea, no vomiting.  No diarrhea.  No constipation. Genitourinary: Negative for dysuria. Musculoskeletal: Negative for back pain. Skin: Negative for rash. Neurological: Negative for headaches, focal weakness or numbness.  10-point ROS otherwise negative.  ____________________________________________   PHYSICAL EXAM:  VITAL SIGNS: ED Triage Vitals [12/10/15 1548]  Enc Vitals Group     BP (!) 146/88     Pulse Rate (!) 119     Resp (!) 26     Temp 98.1 F (36.7 C)     Temp Source Oral     SpO2 92 %     Weight      Height      Head Circumference  Peak Flow      Pain Score      Pain Loc      Pain Edu?      Excl. in GC?     Constitutional: Alert and oriented. Well appearing and in no acute distress. Eyes: Conjunctivae are normal. PERRL. EOMI. Head: Atraumatic. Nose: Mild rhinorrhea Mouth/Throat: Mucous membranes are moist.  Oropharynx non-erythematous. Neck: No stridor.   Cardiovascular: Tachycardic, regular rhythm. Grossly normal heart sounds.  Good peripheral circulation. Respiratory: Normal respiratory effort.  No retractions. Speaks in full sentences. Coarse wheezing throughout with a mildly prolonged expiratory phase Gastrointestinal: Soft and  nontender. No distention. No abdominal bruits. No CVA tenderness. Musculoskeletal: No lower extremity tenderness nor edema.  No joint effusions. Neurologic:  Normal speech and language. No gross focal neurologic deficits are appreciated.  Skin:  Skin is warm, dry and intact. No rash noted. Psychiatric: Mood and affect are normal. Speech and behavior are normal.  ____________________________________________   LABS (all labs ordered are listed, but only abnormal results are displayed)  Labs Reviewed - No data to display ____________________________________________  EKG   ____________________________________________  RADIOLOGY  DG Chest 2 View (Accession [id number]) (Order [id number])  Imaging  Date: 12/10/2015 Department: [facility] EMERGENCY DEPARTMENT Released By: Deon Pilling, RN (auto-released) Authorizing: Myrna Blazer, MD  PACS Images   Show images for DG Chest 2 View  Study Result   CLINICAL DATA:  Shortness of breath and cough for 3 days  EXAM: CHEST  2 VIEW  COMPARISON:  07/06/2015  FINDINGS: The heart size and mediastinal contours are within normal limits. Both lungs are clear. The visualized skeletal structures are unremarkable.  IMPRESSION: No active cardiopulmonary disease.   Electronically Signed   By: Alcide Clever M.D.   On: 12/10/2015 16:31    ____________________________________________   PROCEDURES  Procedure(s) performed:   Procedures  Critical Care performed:   ____________________________________________   INITIAL IMPRESSION / ASSESSMENT AND PLAN / ED COURSE  Pertinent labs & imaging results that were available during my care of the patient were reviewed by me and considered in my medical decision making (see chart for details).    Clinical Course  Comment By Time  Patient now saying that she "feels great." I re-auscultated her lungs and she still has mild diffuse wheezing especially to  the left field but it is greatly improved on the initial exam. She is still about 92% on room air. We will ambulate her to see if she maintains her oxygen saturation. Myrna Blazer, MD 08/06 1832    ----------------------------------------- 7:04 PM on 12/10/2015 -----------------------------------------  Patient ambulated throughout the department at 95% on room air. She had no respiratory distress and is still requesting to be discharged home. She has a refill of her inhaler at the pharmacy but says that it will not be refilled until August 19 because of her insurance companies restrictions on frequent refills. She says that she would be able to pick it up but she would have to pay $70. I encouraged her strongly to pick of the inhaler if possible. The patient is aware that she may return to the emergency department any time for worsening or concerning symptoms.  Respiratory rate 18 upon the time of discharge. Heart rate at 1:15 but I believe this is because of the amount of beta agonist that she has had. Still isn't reporting any chest pain. Symptoms are classic for her asthma flares. Very low probability of an alternate diagnosis. ____________________________________________  FINAL CLINICAL IMPRESSION(S) / ED DIAGNOSES  Asthma exacerbation.    NEW MEDICATIONS STARTED DURING THIS VISIT:  New Prescriptions   No medications on file     Note:  This document was prepared using Dragon voice recognition software and may include unintentional dictation errors.    Myrna Blazeravid Matthew Kaylie Ritter, MD 12/10/15 [phone]

## 2015-12-10 NOTE — ED Notes (Signed)
The patient was walked around the circumference of the ER. Her SPO2 started at 96% and decreased to 95% after. She denies any discomfort or shortness of breath. Patient remain alert and oriented.

## 2016-01-08 ENCOUNTER — Emergency Department
Admission: EM | Admit: 2016-01-08 | Discharge: 2016-01-09 | Disposition: A | Discharge status: Home / Self Care | Payer: BLUE CROSS/BLUE SHIELD | Attending: Emergency Medicine | Admitting: Emergency Medicine

## 2016-01-08 ENCOUNTER — Encounter: Payer: Self-pay | Admitting: Emergency Medicine

## 2016-01-08 DIAGNOSIS — J45901 Unspecified asthma with (acute) exacerbation: Secondary | ICD-10-CM | POA: Diagnosis not present

## 2016-01-08 DIAGNOSIS — Z79899 Other long term (current) drug therapy: Secondary | ICD-10-CM | POA: Diagnosis not present

## 2016-01-08 DIAGNOSIS — F17210 Nicotine dependence, cigarettes, uncomplicated: Secondary | ICD-10-CM | POA: Diagnosis not present

## 2016-01-08 DIAGNOSIS — I10 Essential (primary) hypertension: Secondary | ICD-10-CM | POA: Diagnosis not present

## 2016-01-08 DIAGNOSIS — J441 Chronic obstructive pulmonary disease with (acute) exacerbation: Secondary | ICD-10-CM | POA: Diagnosis not present

## 2016-01-08 DIAGNOSIS — R0602 Shortness of breath: Secondary | ICD-10-CM | POA: Diagnosis present

## 2016-01-08 NOTE — ED Triage Notes (Addendum)
Pt arrived to the ED accompanied  By her husband for complaints of SOB. Pt was taken to a room immediately since she could not speak in complete sentences. Dr. Lenard LancePaduchowski was at bedside upon arrival to the room. Pt is AOx4 in moderate respiratory distress.

## 2016-01-09 ENCOUNTER — Emergency Department: Payer: BLUE CROSS/BLUE SHIELD

## 2016-01-09 LAB — COMPREHENSIVE METABOLIC PANEL
ALBUMIN: 4.7 g/dL (ref 3.5–5.0)
ALK PHOS: 58 U/L (ref 38–126)
ALT: 27 U/L (ref 14–54)
ANION GAP: 8 (ref 5–15)
AST: 31 U/L (ref 15–41)
BUN: 17 mg/dL (ref 6–20)
CALCIUM: 9.6 mg/dL (ref 8.9–10.3)
CO2: 25 mmol/L (ref 22–32)
Chloride: 103 mmol/L (ref 101–111)
Creatinine, Ser: 0.76 mg/dL (ref 0.44–1.00)
GFR calc non Af Amer: >60 mL/min (ref >60)
GLUCOSE: 125 mg/dL — AB (ref 65–99)
POTASSIUM: 4.5 mmol/L (ref 3.5–5.1)
SODIUM: 136 mmol/L (ref 135–145)
Total Bilirubin: 0.6 mg/dL (ref 0.3–1.2)
Total Protein: 8.7 g/dL — ABNORMAL HIGH (ref 6.5–8.1)

## 2016-01-09 LAB — CBC
HCT: 40.5 % (ref 35.0–47.0)
Hemoglobin: 13.9 g/dL (ref 12.0–16.0)
MCH: 32.4 pg (ref 26.0–34.0)
MCHC: 34.4 g/dL (ref 32.0–36.0)
MCV: 94.2 fL (ref 80.0–100.0)
PLATELETS: 289 10*3/uL (ref 150–440)
RBC: 4.3 MIL/uL (ref 3.80–5.20)
RDW: 14.7 % — AB (ref 11.5–14.5)
WBC: 11.6 10*3/uL — ABNORMAL HIGH (ref 3.6–11.0)

## 2016-01-09 LAB — TROPONIN I: Troponin I: <0.03 ng/mL (ref <0.03)

## 2016-01-09 MED ORDER — IPRATROPIUM-ALBUTEROL 0.5-2.5 (3) MG/3ML IN SOLN
3.0000 mL | Freq: Once | RESPIRATORY_TRACT | Status: AC
  Administered 2016-01-09: 3 mL via RESPIRATORY_TRACT

## 2016-01-09 MED ORDER — PREDNISONE 20 MG PO TABS: 40.0000 mg | ORAL_TABLET | Freq: Every day | ORAL | 0 refills | Status: AC

## 2016-01-09 MED ORDER — METHYLPREDNISOLONE SODIUM SUCC 125 MG IJ SOLR
125.0000 mg | Freq: Once | INTRAMUSCULAR | Status: AC
  Administered 2016-01-09: 125 mg via INTRAVENOUS
  Filled 2016-01-09: qty 2

## 2016-01-09 MED ORDER — MAGNESIUM SULFATE 2 GM/50ML IV SOLN
2.0000 g | Freq: Once | INTRAVENOUS | Status: AC
  Administered 2016-01-09: 2 g via INTRAVENOUS
  Filled 2016-01-09: qty 50

## 2016-01-09 NOTE — ED Notes (Signed)
Dr. Lenard LancePaduchowski is at bedside.

## 2016-01-09 NOTE — ED Notes (Signed)
Pt went to X-Ray.  

## 2016-01-09 NOTE — ED Provider Notes (Signed)
[facility] Emergency Department Provider Note  Time seen: 12:15 AM  [facility]   HISTORY  Chief Complaint Shortness of Breath    HPI Caitlin Waters is a 55 y.o. female with a past medical history of COPD, hypertension, who presents the emergency department with shortness of breath. According to the patient beginning this morning she was experiencing shortness of breath which has progressively worsened throughout the day. Patient states a history of asthma, denies COPD although her record review state COPD his past medical history. States mild chest pain which she describes as a tightness sensation. Describes her shortness of breath as significant, chest tightness as moderate. Denies nausea. Denies diaphoresis.  Past Medical History:  Diagnosis Date  . COPD (chronic obstructive pulmonary disease) (HCC)   . HTN (hypertension)   . Thyroid disease   . Tobacco abuse     Patient Active Problem List   Diagnosis Date Noted  . COPD exacerbation (HCC) 05/05/2015    Past Surgical History:  Procedure Laterality Date  . ABDOMINAL HYSTERECTOMY      Prior to Admission medications   Medication Sig Start Date End Date Taking? Authorizing Provider  albuterol (PROVENTIL HFA;VENTOLIN HFA) 108 (90 Base) MCG/ACT inhaler Inhale 2-4 puffs by mouth every 4 hours as needed for wheezing, cough, and/or shortness of breath 07/06/15   Loleta Roseory Forbach, MD  chlorpheniramine-HYDROcodone [facility](TUSSIONEX PENNKINETIC ER) 10-8 MG/5ML SUER Take 5 mLs by mouth 2 (two) times daily. 03/18/15   Emily FilbertJonathan E Williams, MD  levofloxacin (LEVAQUIN) 750 MG tablet Take 1 tablet (750 mg total) by mouth daily. 05/05/15   Adrian SaranSital Mody, MD  levothyroxine (SYNTHROID, LEVOTHROID) 100 MCG tablet Take 100 mcg by mouth daily before breakfast.    Historical Provider, MD  losartan (COZAAR) 25 MG tablet Take 25 mg by mouth daily.    Historical Provider, MD  nicotine  (NICODERM CQ) 14 mg/24hr patch Place 1 patch (14 mg total) onto the skin daily. 05/05/15   Adrian SaranSital Mody, MD  predniSONE (DELTASONE) 20 MG tablet Take 3 tablets (60 mg total) by mouth daily. 12/10/15 12/09/16  Myrna Blazeravid Matthew Schaevitz, MD    Allergies  Allergen Reactions  . Codeine Other (See Comments)    Was prescribed in past cough syrup with codeine that made her "feel funny."    Family History  Problem Relation Age of Onset  . Breast cancer Neg Hx     Social History Social History  Substance Use Topics  . Smoking status: Current Every Day Smoker    Packs/day: 0.10    Types: Cigarettes  . Smokeless tobacco: Never Used  . Alcohol use 0.0 oz/week    Review of Systems Constitutional: Negative for fever. CardiovascularPositive for chest tightness Respiratory: Positive for shortness of breath Gastrointestinal: Negative for abdominal pain Musculoskeletal: Negative for back pain. Neurological: Negative for headache 10-point ROS otherwise negative.  ____________________________________________   PHYSICAL EXAM:  VITAL SIGNS: ED Triage Vitals  Enc Vitals Group     BP 01/08/16 2355 132/86     Pulse Rate 01/08/16 2355 86     Resp 01/08/16 2355 (!) 24     Temp 01/08/16 2355 98 F (36.7 C)     Temp Source 01/08/16 2355 Oral     SpO2 01/08/16 2351 95 %     Weight 01/08/16 2356 145 lb (65.8 kg)     Height 01/08/16 2356 5\' 8"  (1.727 m)     Head Circumference --  Peak Flow --      Pain Score 01/08/16 2357 0     Pain Loc --      Pain Edu? --      Excl. in GC? --     Constitutional: Alert and oriented. Moderate distress due to shortness breath, sitting upright in a tripod position. Eyes: Normal exam ENT   Head: Normocephalic and atraumatic   Mouth/Throat: Mucous membranes are moist. Cardiovascular: Normal rate, regular rhythm. No murmur Respiratory: Moderate respiratory distress due to shortness of breath. Moderate wheeze bilaterally. Gastrointestinal: Soft and  nontender. No distention.   Musculoskeletal: Nontender with normal range of motion in all extremities. Neurologic:  Normal speech and language. No gross focal neurologic deficits Skin:  Skin is warm, dry and intact.  Psychiatric: Mood and affect are normal.   ____________________________________________    EKG  EKG reviewed and interpreted by myself shows normal sinus rhythm at 90 bpm, narrow QRS, normal axis, normal intervals, nonspecific ST changes without concerning ST elevation.  ____________________________________________    RADIOLOGY  Chest x-ray negative  ____________________________________________   INITIAL IMPRESSION / ASSESSMENT AND PLAN / ED COURSE  Pertinent labs & imaging results that were available during my care of the patient were reviewed by me and considered in my medical decision making (see chart for details).  The patient presents the emergency department with shortness of breath worsening since this morning. Patient is in moderate distress due to shortness of breath, sitting upright in a tripod position, moderate wheezes bilaterally. We will start the patient on DuoNeb's, Solu-Medrol, magnesium, and close monitor in the emergency department.  ----------------------------------------- 2:00 AM on 01/09/2016 -----------------------------------------  Patient appears dramatically improved, no distress, breathing comfortably no wheezes on exam. Patient has a 99% room air saturation currently. We will continue to monitor in the emergency department, will discharge with steroids. The patient states she ran out of her albuterol inhaler but her doctor called her in a refill Which is at the pharmacy waiting for her to pick it up.  ____________________________________________   FINAL CLINICAL IMPRESSION(S) / ED DIAGNOSES  Asthma exacerbation    Minna Antis, MD 01/09/16 0201

## 2016-06-27 ENCOUNTER — Other Ambulatory Visit: Payer: Self-pay | Admitting: Family Medicine

## 2016-06-27 DIAGNOSIS — Z1231 Encounter for screening mammogram for malignant neoplasm of breast: Secondary | ICD-10-CM

## 2016-07-23 ENCOUNTER — Ambulatory Visit
Admission: RE | Admit: 2016-07-23 | Discharge: 2016-07-23 | Disposition: A | Discharge status: Home / Self Care | Payer: BLUE CROSS/BLUE SHIELD | Source: Ambulatory Visit | Attending: Family Medicine | Admitting: Family Medicine

## 2016-07-23 DIAGNOSIS — Z1231 Encounter for screening mammogram for malignant neoplasm of breast: Secondary | ICD-10-CM | POA: Diagnosis present

## 2017-01-13 DIAGNOSIS — F410 Panic disorder [episodic paroxysmal anxiety] without agoraphobia: Secondary | ICD-10-CM | POA: Insufficient documentation

## 2017-01-14 DIAGNOSIS — E782 Mixed hyperlipidemia: Secondary | ICD-10-CM | POA: Insufficient documentation

## 2017-02-19 ENCOUNTER — Encounter: Payer: Self-pay | Admitting: Emergency Medicine

## 2017-02-19 ENCOUNTER — Emergency Department: Payer: BLUE CROSS/BLUE SHIELD

## 2017-02-19 ENCOUNTER — Inpatient Hospital Stay
Admission: EM | Admit: 2017-02-19 | Discharge: 2017-02-20 | DRG: 190 | Disposition: A | Discharge status: Home / Self Care | Payer: BLUE CROSS/BLUE SHIELD | Attending: Internal Medicine | Admitting: Internal Medicine

## 2017-02-19 DIAGNOSIS — F17210 Nicotine dependence, cigarettes, uncomplicated: Secondary | ICD-10-CM | POA: Diagnosis present

## 2017-02-19 DIAGNOSIS — J209 Acute bronchitis, unspecified: Secondary | ICD-10-CM | POA: Diagnosis present

## 2017-02-19 DIAGNOSIS — J9621 Acute and chronic respiratory failure with hypoxia: Secondary | ICD-10-CM | POA: Diagnosis present

## 2017-02-19 DIAGNOSIS — J440 Chronic obstructive pulmonary disease with acute lower respiratory infection: Secondary | ICD-10-CM | POA: Diagnosis present

## 2017-02-19 DIAGNOSIS — J441 Chronic obstructive pulmonary disease with (acute) exacerbation: Principal | ICD-10-CM | POA: Diagnosis present

## 2017-02-19 DIAGNOSIS — F17200 Nicotine dependence, unspecified, uncomplicated: Secondary | ICD-10-CM

## 2017-02-19 DIAGNOSIS — J9600 Acute respiratory failure, unspecified whether with hypoxia or hypercapnia: Secondary | ICD-10-CM | POA: Diagnosis present

## 2017-02-19 DIAGNOSIS — R0602 Shortness of breath: Secondary | ICD-10-CM

## 2017-02-19 DIAGNOSIS — Z23 Encounter for immunization: Secondary | ICD-10-CM

## 2017-02-19 DIAGNOSIS — E039 Hypothyroidism, unspecified: Secondary | ICD-10-CM | POA: Diagnosis present

## 2017-02-19 DIAGNOSIS — J40 Bronchitis, not specified as acute or chronic: Secondary | ICD-10-CM

## 2017-02-19 DIAGNOSIS — I10 Essential (primary) hypertension: Secondary | ICD-10-CM | POA: Diagnosis present

## 2017-02-19 DIAGNOSIS — J9622 Acute and chronic respiratory failure with hypercapnia: Secondary | ICD-10-CM | POA: Diagnosis present

## 2017-02-19 LAB — BLOOD GAS, VENOUS
ACID-BASE EXCESS: 0.2 mmol/L (ref 0.0–2.0)
Bicarbonate: 26.5 mmol/L (ref 20.0–28.0)
O2 SAT: 87.1 %
PCO2 VEN: 48 mmHg (ref 44.0–60.0)
PO2 VEN: 56 mmHg — AB (ref 32.0–45.0)
Patient temperature: 37
pH, Ven: 7.35 (ref 7.250–7.430)

## 2017-02-19 LAB — CBC
HEMATOCRIT: 41.1 % (ref 35.0–47.0)
Hemoglobin: 13.9 g/dL (ref 12.0–16.0)
MCH: 32.2 pg (ref 26.0–34.0)
MCHC: 33.7 g/dL (ref 32.0–36.0)
MCV: 95.4 fL (ref 80.0–100.0)
Platelets: 275 10*3/uL (ref 150–440)
RBC: 4.31 MIL/uL (ref 3.80–5.20)
RDW: 13.9 % (ref 11.5–14.5)
WBC: 8 10*3/uL (ref 3.6–11.0)

## 2017-02-19 LAB — BASIC METABOLIC PANEL
Anion gap: 8 (ref 5–15)
BUN: 11 mg/dL (ref 6–20)
CO2: 27 mmol/L (ref 22–32)
Calcium: 9.4 mg/dL (ref 8.9–10.3)
Chloride: 103 mmol/L (ref 101–111)
Creatinine, Ser: 0.78 mg/dL (ref 0.44–1.00)
GFR calc Af Amer: >60 mL/min (ref >60)
GFR calc non Af Amer: >60 mL/min (ref >60)
Glucose, Bld: 191 mg/dL — ABNORMAL HIGH (ref 65–99)
POTASSIUM: 4.1 mmol/L (ref 3.5–5.1)
SODIUM: 138 mmol/L (ref 135–145)

## 2017-02-19 LAB — TROPONIN I
Troponin I: 0.04 ng/mL (ref <0.03)
Troponin I: 0.25 ng/mL (ref <0.03)
Troponin I: 0.33 ng/mL (ref <0.03)

## 2017-02-19 LAB — GLUCOSE, CAPILLARY
Glucose-Capillary: 245 mg/dL — ABNORMAL HIGH (ref 65–99)
Glucose-Capillary: 152 mg/dL — ABNORMAL HIGH (ref 65–99)
Glucose-Capillary: 198 mg/dL — ABNORMAL HIGH (ref 65–99)

## 2017-02-19 LAB — MRSA PCR SCREENING: MRSA by PCR: NEGATIVE

## 2017-02-19 MED ORDER — CHLORHEXIDINE GLUCONATE 0.12 % MT SOLN: 15.0000 mL | Freq: Two times a day (BID) | OROMUCOSAL | Status: DC

## 2017-02-19 MED ORDER — ACETAMINOPHEN 650 MG RE SUPP: 650.0000 mg | Freq: Four times a day (QID) | RECTAL | Status: DC | PRN

## 2017-02-19 MED ORDER — SODIUM CHLORIDE 0.9 % IV BOLUS (SEPSIS)
1000.0000 mL | Freq: Once | INTRAVENOUS | Status: AC
  Administered 2017-02-19: 1000 mL via INTRAVENOUS

## 2017-02-19 MED ORDER — SODIUM CHLORIDE 0.9% FLUSH: 3.0000 mL | INTRAVENOUS | Status: DC | PRN

## 2017-02-19 MED ORDER — MAGNESIUM SULFATE 2 GM/50ML IV SOLN
INTRAVENOUS | Status: AC
  Administered 2017-02-19: 2 g via INTRAVENOUS
  Filled 2017-02-19: qty 50

## 2017-02-19 MED ORDER — BUDESONIDE 0.25 MG/2ML IN SUSP
0.2500 mg | Freq: Four times a day (QID) | RESPIRATORY_TRACT | Status: DC
  Administered 2017-02-19 – 2017-02-20 (×5): 0.25 mg via RESPIRATORY_TRACT
  Filled 2017-02-19 (×5): qty 2

## 2017-02-19 MED ORDER — ENOXAPARIN SODIUM 40 MG/0.4ML ~~LOC~~ SOLN
40.0000 mg | SUBCUTANEOUS | Status: DC
  Administered 2017-02-19: 40 mg via SUBCUTANEOUS
  Filled 2017-02-19: qty 0.4

## 2017-02-19 MED ORDER — IPRATROPIUM-ALBUTEROL 0.5-2.5 (3) MG/3ML IN SOLN
3.0000 mL | Freq: Once | RESPIRATORY_TRACT | Status: AC
  Administered 2017-02-19: 3 mL via RESPIRATORY_TRACT

## 2017-02-19 MED ORDER — LEVOTHYROXINE SODIUM 112 MCG PO TABS
112.0000 ug | ORAL_TABLET | Freq: Every day | ORAL | Status: DC
  Administered 2017-02-19 – 2017-02-20 (×2): 112 ug via ORAL
  Filled 2017-02-19 (×2): qty 1

## 2017-02-19 MED ORDER — NICOTINE 7 MG/24HR TD PT24
7.0000 mg | MEDICATED_PATCH | Freq: Every day | TRANSDERMAL | Status: DC
  Administered 2017-02-19 – 2017-02-20 (×2): 7 mg via TRANSDERMAL
  Filled 2017-02-19 (×2): qty 1

## 2017-02-19 MED ORDER — SENNOSIDES-DOCUSATE SODIUM 8.6-50 MG PO TABS: 1.0000 | ORAL_TABLET | Freq: Every evening | ORAL | Status: DC | PRN

## 2017-02-19 MED ORDER — DEXTROSE 5 % IV SOLN
1.0000 g | INTRAVENOUS | Status: DC
  Administered 2017-02-19: 1 g via INTRAVENOUS
  Filled 2017-02-19: qty 10

## 2017-02-19 MED ORDER — LOSARTAN POTASSIUM 25 MG PO TABS
25.0000 mg | ORAL_TABLET | Freq: Every day | ORAL | Status: DC
  Administered 2017-02-19 – 2017-02-20 (×2): 25 mg via ORAL
  Filled 2017-02-19 (×2): qty 1

## 2017-02-19 MED ORDER — ONDANSETRON HCL 4 MG/2ML IJ SOLN: 4.0000 mg | Freq: Four times a day (QID) | INTRAMUSCULAR | Status: DC | PRN

## 2017-02-19 MED ORDER — ALBUTEROL SULFATE (2.5 MG/3ML) 0.083% IN NEBU: 2.5000 mg | INHALATION_SOLUTION | RESPIRATORY_TRACT | Status: DC | PRN

## 2017-02-19 MED ORDER — IPRATROPIUM-ALBUTEROL 0.5-2.5 (3) MG/3ML IN SOLN: 3.0000 mL | RESPIRATORY_TRACT | Status: DC

## 2017-02-19 MED ORDER — ALBUTEROL SULFATE (2.5 MG/3ML) 0.083% IN NEBU
INHALATION_SOLUTION | RESPIRATORY_TRACT | Status: AC
  Administered 2017-02-19: 2.5 mg via RESPIRATORY_TRACT
  Filled 2017-02-19: qty 3

## 2017-02-19 MED ORDER — MAGNESIUM SULFATE 2 GM/50ML IV SOLN
2.0000 g | Freq: Once | INTRAVENOUS | Status: AC
  Administered 2017-02-19: 2 g via INTRAVENOUS

## 2017-02-19 MED ORDER — METHYLPREDNISOLONE SODIUM SUCC 125 MG IJ SOLR: 125.0000 mg | Freq: Four times a day (QID) | INTRAMUSCULAR | Status: DC

## 2017-02-19 MED ORDER — PNEUMOCOCCAL VAC POLYVALENT 25 MCG/0.5ML IJ INJ
0.5000 mL | INJECTION | INTRAMUSCULAR | Status: DC
  Filled 2017-02-19: qty 0.5

## 2017-02-19 MED ORDER — ACETAMINOPHEN 325 MG PO TABS: 650.0000 mg | ORAL_TABLET | Freq: Four times a day (QID) | ORAL | Status: DC | PRN

## 2017-02-19 MED ORDER — IPRATROPIUM-ALBUTEROL 0.5-2.5 (3) MG/3ML IN SOLN
RESPIRATORY_TRACT | Status: AC
  Administered 2017-02-19: 3 mL via RESPIRATORY_TRACT
  Filled 2017-02-19: qty 3

## 2017-02-19 MED ORDER — ORAL CARE MOUTH RINSE
15.0000 mL | Freq: Two times a day (BID) | OROMUCOSAL | Status: DC
  Administered 2017-02-19: 15 mL via OROMUCOSAL

## 2017-02-19 MED ORDER — ALBUTEROL SULFATE (2.5 MG/3ML) 0.083% IN NEBU
2.5000 mg | INHALATION_SOLUTION | Freq: Once | RESPIRATORY_TRACT | Status: AC
  Administered 2017-02-19: 2.5 mg via RESPIRATORY_TRACT

## 2017-02-19 MED ORDER — SODIUM CHLORIDE 0.9% FLUSH
3.0000 mL | Freq: Two times a day (BID) | INTRAVENOUS | Status: DC
  Administered 2017-02-19 – 2017-02-20 (×3): 3 mL via INTRAVENOUS

## 2017-02-19 MED ORDER — IPRATROPIUM-ALBUTEROL 0.5-2.5 (3) MG/3ML IN SOLN
3.0000 mL | Freq: Four times a day (QID) | RESPIRATORY_TRACT | Status: DC
  Administered 2017-02-19 – 2017-02-20 (×6): 3 mL via RESPIRATORY_TRACT
  Filled 2017-02-19 (×6): qty 3

## 2017-02-19 MED ORDER — ORAL CARE MOUTH RINSE: 15.0000 mL | Freq: Two times a day (BID) | OROMUCOSAL | Status: DC

## 2017-02-19 MED ORDER — ONDANSETRON HCL 4 MG PO TABS: 4.0000 mg | ORAL_TABLET | Freq: Four times a day (QID) | ORAL | Status: DC | PRN

## 2017-02-19 MED ORDER — DEXTROSE 5 % IV SOLN: 500.0000 mg | INTRAVENOUS | Status: DC

## 2017-02-19 MED ORDER — METHYLPREDNISOLONE SODIUM SUCC 125 MG IJ SOLR
80.0000 mg | Freq: Two times a day (BID) | INTRAMUSCULAR | Status: DC
  Administered 2017-02-19 – 2017-02-20 (×2): 80 mg via INTRAVENOUS
  Filled 2017-02-19 (×2): qty 2

## 2017-02-19 MED ORDER — DEXTROSE 5 % IV SOLN
1.0000 g | INTRAVENOUS | Status: DC
  Administered 2017-02-20: 1 g via INTRAVENOUS
  Filled 2017-02-19: qty 10

## 2017-02-19 MED ORDER — DEXTROSE 5 % IV SOLN
500.0000 mg | INTRAVENOUS | Status: DC
  Administered 2017-02-19: 500 mg via INTRAVENOUS
  Filled 2017-02-19: qty 500

## 2017-02-19 MED ORDER — SODIUM CHLORIDE 0.9 % IV SOLN: 250.0000 mL | INTRAVENOUS | Status: DC | PRN

## 2017-02-19 MED ORDER — INSULIN ASPART 100 UNIT/ML ~~LOC~~ SOLN
0.0000 [IU] | Freq: Three times a day (TID) | SUBCUTANEOUS | Status: DC
  Administered 2017-02-19: 3 [IU] via SUBCUTANEOUS
  Administered 2017-02-20: 2 [IU] via SUBCUTANEOUS
  Administered 2017-02-20: 0.3 [IU] via SUBCUTANEOUS
  Filled 2017-02-19 (×3): qty 1

## 2017-02-19 NOTE — Progress Notes (Signed)
Pharmacy Antibiotic Note  Caitlin Waters is a 56 y.o. female admitted on 02/19/2017 with COPD flare.  Pharmacy has been consulted for ceftriaxone dosing.  Plan: Ceftriaxone 1 gram q 24 hours ordered.  Weight: 145 lb (65.8 kg)  Temp (24hrs), Avg:98 F (36.7 C), Min:98 F (36.7 C), Max:98 F (36.7 C)   Recent Labs Lab 02/19/17 0311  WBC 8.0  CREATININE 0.78    CrCl cannot be calculated (Unknown ideal weight.).    Allergies  Allergen Reactions  . Codeine Other (See Comments)    Was prescribed in past cough syrup with codeine that made her "feel funny."    Antimicrobials this admission: Azithromycin, ceftriaxone 10/17  >>    >>   Dose adjustments this admission:   Microbiology results: No micro  Thank you for allowing pharmacy to be a part of this patient's care.  Torin Whisner S 02/19/2017 5:32 AM

## 2017-02-19 NOTE — Significant Event (Signed)
PFTs 04/2016  FVC was 2.18 liters, 79% of predicted FEV1 was 1.32, 59% of predicted FEV1 ratio was 61 FEF 25-75% liters per second was 17% of predicted *Pt took Albuterol 45 mins prior to testing, View PFT as Post  LUNG VOLUMES: TLC was 65% of predicted RV was 67% of predicted  DIFFUSION CAPACITY: DLCO was 69% of predicted DLCO/VA was 106% of predicted    [name], MD PCCM service Mobile [phone] Pager [phone] 02/19/2017 9:34 AM

## 2017-02-19 NOTE — Progress Notes (Signed)
[facility] notified of elevated troponin, no new orders at this time.  Pt denies CP, heaviness, numbenss.  Does have some nausea and loss of appetite x 3 days

## 2017-02-19 NOTE — ED Provider Notes (Signed)
[facility] Emergency Department Provider Note   ____________________________________________   First MD Initiated Contact with Patient 02/19/17 [phone]     (approximate)  I have reviewed the triage vital signs and the nursing notes.   HISTORY  Chief Complaint Shortness of Breath  history Limited due to patient respiratory distress  HPI Caitlin Waters is a 56 y.o. female who comes into the hospital today with some shortness of breath. The patient has had a cold for the past couple of days and has a history of asthma and COPD. She contacted EMS with severe respiratory distress. Per EMS the patient had 2 DuoNeb's and some Solu-Medrol. She had some crackles and wheezes in her shortness of breath became worse with exertion. The patient states that she caught a cold in his felt bad for the past 3-4 days. She's been using her Ventolin inhaler at home without any relief. She reports that she does not typically wear oxygen at home. She felt hot and thought she had a fever. She's been taking NyQuil for symptoms at home. She contacted EMS when she couldn't tolerate the symptoms any further.   Past Medical History:  Diagnosis Date  . COPD (chronic obstructive pulmonary disease) (HCC)   . HTN (hypertension)   . Thyroid disease   . Tobacco abuse     Patient Active Problem List   Diagnosis Date Noted  . Acute respiratory failure (HCC) 02/19/2017  . COPD exacerbation (HCC) 05/05/2015    Past Surgical History:  Procedure Laterality Date  . ABDOMINAL HYSTERECTOMY      Prior to Admission medications   Medication Sig Start Date End Date Taking? Authorizing Provider  albuterol (PROVENTIL HFA;VENTOLIN HFA) 108 (90 Base) MCG/ACT inhaler Inhale 2-4 puffs by mouth every 4 hours as needed for wheezing, cough, and/or shortness of breath 07/06/15  Yes Loleta RoseForbach, Cory, MD  levothyroxine (SYNTHROID, LEVOTHROID) 112 MCG tablet Take 112 mcg by mouth daily before breakfast.    Yes  [provider]  loratadine (CLARITIN) 10 MG tablet Take 10 mg by mouth daily.   Yes [provider]  losartan (COZAAR) 25 MG tablet Take 25 mg by mouth daily.   Yes [provider]  PARoxetine (PAXIL) 20 MG tablet Take 20 mg by mouth daily.   Yes [provider]    Allergies Codeine  Family History  Problem Relation Age of Onset  . Breast cancer Maternal Grandmother   . Dementia Mother   . COPD Neg Hx   . Hypertension Neg Hx     Social History Social History  Substance Use Topics  . Smoking status: Current Every Day Smoker    Packs/day: 0.10    Types: Cigarettes  . Smokeless tobacco: Never Used  . Alcohol use 0.0 oz/week    Review of Systems  Constitutional:  fever Eyes: No visual changes. ENT: nasal congestion Cardiovascular: Denies chest pain. Respiratory: cough and shortness of breath. Gastrointestinal: No abdominal pain.  No nausea, no vomiting.  No diarrhea.  No constipation. Genitourinary: Negative for dysuria. Musculoskeletal: Negative for back pain. Skin: Negative for rash. Neurological: Negative for headaches, focal weakness or numbness.   ____________________________________________   PHYSICAL EXAM:  VITAL SIGNS: ED Triage Vitals  Enc Vitals Group     BP 02/19/17 0319 (!) 142/92     Pulse Rate 02/19/17 0319 (!) 120     Resp 02/19/17 0319 (!) 30     Temp 02/19/17 0319 98 F (36.7 C)     Temp  Source 02/19/17 0319 Oral     SpO2 02/19/17 0313 92 %     Weight 02/19/17 0314 145 lb (65.8 kg)     Height --      Head Circumference --      Peak Flow --      Pain Score --      Pain Loc --      Pain Edu? --      Excl. in GC? --     Constitutional: Alert and oriented. Ill appearing and in respiratory distress. Eyes: Conjunctivae are normal. PERRL. EOMI. Head: Atraumatic. Nose: No congestion/rhinnorhea. Mouth/Throat: Mucous membranes are moist.  Oropharynx non-erythematous. Cardiovascular: Tachycardia, regular  rhythm. Grossly normal heart sounds.  Good peripheral circulation. Respiratory: Increased respiratory effort, retractions. expiratory wheezes in all lung fields. Gastrointestinal: Soft and nontender. No distention. positive bowel sounds Musculoskeletal: No lower extremity tenderness nor edema.   Neurologic:  Normal speech and language.  Skin:  Skin is warm, dry and intact.  Psychiatric: Mood and affect are normal.   ____________________________________________   LABS (all labs ordered are listed, but only abnormal results are displayed)  Labs Reviewed  BASIC METABOLIC PANEL - Abnormal; Notable for the following:       Result Value   Glucose, Bld 191 (*)    All other components within normal limits  TROPONIN I - Abnormal; Notable for the following:    Troponin I 0.04 (*)    All other components within normal limits  BLOOD GAS, VENOUS - Abnormal; Notable for the following:    pO2, Ven 56.0 (*)    All other components within normal limits  CBC  TROPONIN I   ____________________________________________  EKG  ED ECG REPORT I, Rebecka Apley, the attending physician, personally viewed and interpreted this ECG.   Date: 02/19/2017  EKG Time: 317  Rate: 116  Rhythm: sinus tachycardia  Axis: normal  Intervals:none  ST&T Change: none  ____________________________________________  RADIOLOGY  Dg Chest Portable 1 View  Result Date: 02/19/2017 CLINICAL DATA:  56 y/o  F; shortness of breath. EXAM: PORTABLE CHEST 1 VIEW COMPARISON:  01/09/2016 chest radiograph. FINDINGS: Stable normal cardiac silhouette given projection and technique. Clear lungs. No pleural effusion or pneumothorax. Mild reverse S curvature of the spine. IMPRESSION: No active disease. Electronically Signed   By: Mitzi Hansen M.D.   On: 02/19/2017 03:34    ____________________________________________   PROCEDURES  Procedure(s) performed: None  Procedures  Critical Care performed: Yes, see  critical care note(s)   CRITICAL CARE Performed by: Lucrezia Europe P   Total critical care time: 30 minutes  Critical care time was exclusive of separately billable procedures and treating other patients.  Critical care was necessary to treat or prevent imminent or life-threatening deterioration.  Critical care was time spent personally by me on the following activities: development of treatment plan with patient and/or surrogate as well as nursing, discussions with consultants, evaluation of patient's response to treatment, examination of patient, obtaining history from patient or surrogate, ordering and performing treatments and interventions, ordering and review of laboratory studies, ordering and review of radiographic studies, pulse oximetry and re-evaluation of patient's condition.   ____________________________________________   INITIAL IMPRESSION / ASSESSMENT AND PLAN / ED COURSE  As part of my medical decision making, I reviewed the following data within the electronic MEDICAL RECORD NUMBER Notes from prior ED visits and  Controlled Substance Database   This is a 56 year old female who comes into the hospital today with  some respiratory distress.  The differential diagnosis includes COPD exacerbation, asthma exacerbation, bronchitis, pneumonia.  When the patient arrived by ambulance I did give her a DuoNeb treatment and albuterol treatment and a dose of magnesium. The patient was speaking in 2-3 word sentences and social cooing. She also had some significant wheezing. After the medication we did continue to monitor the patient. She started having an O2 requirement and had oxygen saturations in the low 90s on 5 L. The decision was made to start the patient on BiPAP. We did check some blood work and it was unremarkable. The patient's PCO2 is unremarkable. The patient's chest x-ray also did not show any pneumonia. I feel the patient either has an asthma exacerbation or some  bronchitis. She did receive a liter of normal saline. The patient will be admitted to the hospitalist service for further evaluation of her shortness of breath and wheezing. The patient's breathing did improve and her oxygenation improved on BiPAP.      ____________________________________________   FINAL CLINICAL IMPRESSION(S) / ED DIAGNOSES  Final diagnoses:  SOB (shortness of breath)  COPD exacerbation (HCC)  Bronchitis      NEW MEDICATIONS STARTED DURING THIS VISIT:  New Prescriptions   No medications on file     Note:  This document was prepared using Dragon voice recognition software and may include unintentional dictation errors.    Rebecka Apley, MD 02/19/17 [phone]

## 2017-02-19 NOTE — Progress Notes (Signed)
[facility] pt will transport via Ortho bed to room 137 w/ port O2, VSS, pt is A&OX4, chart, meds, and belongings will be with her

## 2017-02-19 NOTE — Progress Notes (Signed)
[facility] - [place] at [facility]   PATIENT NAME: Caitlin Waters    MR#:  [id number]  DATE OF BIRTH:  1961/04/22  SUBJECTIVE:  Came in with increasing SOB and wheezing  REVIEW OF SYSTEMS:   Review of Systems  Constitutional: Negative for chills, fever and weight loss.  HENT: Negative for ear discharge, ear pain and nosebleeds.   Eyes: Negative for blurred vision, pain and discharge.  Respiratory: Positive for shortness of breath and wheezing. Negative for sputum production and stridor.   Cardiovascular: Negative for chest pain, palpitations, orthopnea and PND.  Gastrointestinal: Negative for abdominal pain, diarrhea, nausea and vomiting.  Genitourinary: Negative for frequency and urgency.  Musculoskeletal: Negative for back pain and joint pain.  Neurological: Positive for weakness. Negative for sensory change, speech change and focal weakness.  Psychiatric/Behavioral: Negative for depression and hallucinations. The patient is not nervous/anxious.    Tolerating Diet:yes Tolerating PT:   DRUG ALLERGIES:   Allergies  Allergen Reactions  . Codeine Other (See Comments)    Was prescribed in past cough syrup with codeine that made her "feel funny."    VITALS:  Blood pressure 133/79, pulse (!) 103, temperature 98.3 F (36.8 C), temperature source Oral, resp. rate (!) 27, height 5\' 8"  (1.727 m), weight 72.3 kg (159 lb 6.3 oz), SpO2 98 %.  PHYSICAL EXAMINATION:   Physical Exam  GENERAL:  56 y.o.-year-old patient lying in the bed with no acute distress.  EYES: Pupils equal, round, reactive to light and accommodation. No scleral icterus. Extraocular muscles intact.  HEENT: Head atraumatic, normocephalic. Oropharynx and nasopharynx clear.  NECK:  Supple, no jugular venous distention. No thyroid enlargement, no tenderness.  LUNGS: Normal breath sounds bilaterally, ++ wheezing, no rales, rhonchi. No use of accessory muscles of respiration.  CARDIOVASCULAR:  S1, S2 normal. No murmurs, rubs, or gallops.  ABDOMEN: Soft, nontender, nondistended. Bowel sounds present. No organomegaly or mass.  EXTREMITIES: No cyanosis, clubbing or edema b/l.    NEUROLOGIC: Cranial nerves II through XII are intact. No focal Motor or sensory deficits b/l.   PSYCHIATRIC:  patient is alert and oriented x 3.  SKIN: No obvious rash, lesion, or ulcer.   LABORATORY PANEL:  CBC  Recent Labs Lab 02/19/17 0311  WBC 8.0  HGB 13.9  HCT 41.1  PLT 275    Chemistries   Recent Labs Lab 02/19/17 0311  NA 138  K 4.1  CL 103  CO2 27  GLUCOSE 191*  BUN 11  CREATININE 0.78  CALCIUM 9.4   Cardiac Enzymes  Recent Labs Lab 02/19/17 0802  TROPONINI 0.33*   RADIOLOGY:  Dg Chest Portable 1 View  Result Date: 02/19/2017 CLINICAL DATA:  56 y/o  F; shortness of breath. EXAM: PORTABLE CHEST 1 VIEW COMPARISON:  01/09/2016 chest radiograph. FINDINGS: Stable normal cardiac silhouette given projection and technique. Clear lungs. No pleural effusion or pneumothorax. Mild reverse S curvature of the spine. IMPRESSION: No active disease. Electronically Signed   By: Mitzi Hansen M.D.   On: 02/19/2017 03:34   ASSESSMENT AND PLAN:   Nyriah Coote  is a 56 y.o. female with a known history of cOPD not on home oxygen, hypertension, hypothyroidism, tobacco abuse presented to the emergency room with increased shortness of breath since last 2 days. Patient has cough and shortness of breath. She has wheezing in both lungs  1. Acute on chronic COPD exacerbation -Now off bipap -IV steroids, nebs, inhalers -smoking cessation advised  2.Acute respiratory distress--improvng -as above  3. Hypertension -losartan  4. Hypothyroidism -cont synthroid  Case discussed with Care Management/Social Worker. Management plans discussed with the patient, family and they are in agreement.  CODE STATUS: FULL  DVT Prophylaxis: lovenox  TOTAL TIME TAKING CARE OF THIS PATIENT: *30*  minutes.  >50% time spent on counselling and coordination of care  POSSIBLE D/C IN *1-2* DAYS, DEPENDING ON CLINICAL CONDITION.  Note: This dictation was prepared with Dragon dictation along with smaller phrase technology. Any transcriptional errors that result from this process are unintentional.  Zimal Weisensel M.D on 02/19/2017 at 6:36 PM  Between 7am to 6pm - Pager - [phone]  After 6pm go to [email or web address] - Social research officer, governmentpassword [email or web address]  [facility]  Office  [phone]  CC: Primary care physician; Marisue IvanLinthavong, Kanhka, MDPatient ID: Caitlin PostinWilma E Waters, female   DOB: [birth year], 56 y.o.   MRN: [id number]

## 2017-02-19 NOTE — H&P (Signed)
[facility] - [place] at [facility]   PATIENT NAME: Caitlin Waters    MR#:  [id number]  DATE OF BIRTH:  May 21, 1960  DATE OF ADMISSION:  02/19/2017  PRIMARY CARE PHYSICIAN: Marisue Ivan, MD   REQUESTING/REFERRING PHYSICIAN:   CHIEF COMPLAINT:   Chief Complaint  Patient presents with  . Shortness of Breath    HISTORY OF PRESENT ILLNESS: Caitlin Waters  is a 56 y.o. female with a known history of cOPD not on home oxygen, hypertension, hypothyroidism, tobacco abuse presented to the emergency room with increased shortness of breath since last 2 days. Patient has cough and shortness of breath. She has wheezing in both lungs. She presented to the emergency room she was put on oxygen via nasal cannula initially at 5 L and was given multiple breathing treatments. She was still continuously wheezing and had to be put on BiPAP. No complaints of any chest pain. No fever and chills. No recent travel or sick contacts at home. Patient was given breathing treatments in the emergency room and hospitalist service was consulted.  PAST MEDICAL HISTORY:   Past Medical History:  Diagnosis Date  . COPD (chronic obstructive pulmonary disease) (HCC)   . HTN (hypertension)   . Thyroid disease   . Tobacco abuse     PAST SURGICAL HISTORY: Past Surgical History:  Procedure Laterality Date  . ABDOMINAL HYSTERECTOMY      SOCIAL HISTORY:  Social History  Substance Use Topics  . Smoking status: Current Every Day Smoker    Packs/day: 0.10    Types: Cigarettes  . Smokeless tobacco: Never Used  . Alcohol use 0.0 oz/week    FAMILY HISTORY:  Family History  Problem Relation Age of Onset  . Breast cancer Maternal Grandmother   . Dementia Mother   . COPD Neg Hx   . Hypertension Neg Hx     DRUG ALLERGIES:  Allergies  Allergen Reactions  . Codeine Other (See Comments)    Was prescribed in past cough syrup with codeine that made her "feel funny."    REVIEW OF SYSTEMS:    CONSTITUTIONAL: No fever, fatigue or weakness.  EYES: No blurred or double vision.  EARS, NOSE, AND THROAT: No tinnitus or ear pain.  RESPIRATORY: Has cough, shortness of breath, wheezing  No hemoptysis.  CARDIOVASCULAR: No chest pain, orthopnea, edema.  GASTROINTESTINAL: No nausea, vomiting, diarrhea or abdominal pain.  GENITOURINARY: No dysuria, hematuria.  ENDOCRINE: No polyuria, nocturia,  HEMATOLOGY: No anemia, easy bruising or bleeding SKIN: No rash or lesion. MUSCULOSKELETAL: No joint pain or arthritis.   NEUROLOGIC: No tingling, numbness, weakness.  PSYCHIATRY: No anxiety or depression.   MEDICATIONS AT HOME:  Prior to Admission medications   Medication Sig Start Date End Date Taking? Authorizing Provider  albuterol (PROVENTIL HFA;VENTOLIN HFA) 108 (90 Base) MCG/ACT inhaler Inhale 2-4 puffs by mouth every 4 hours as needed for wheezing, cough, and/or shortness of breath 07/06/15   Loleta Rose, MD  levothyroxine (SYNTHROID, LEVOTHROID) 112 MCG tablet Take 112 mcg by mouth daily before breakfast.     [provider]  losartan (COZAAR) 25 MG tablet Take 25 mg by mouth daily.    [provider]      PHYSICAL EXAMINATION:   VITAL SIGNS: Blood pressure (!) 142/92, pulse (!) 120, temperature 98 F (36.7 C), temperature source Oral, resp. rate (!) 30, weight 65.8 kg (145 lb), SpO2 100 %.  GENERAL:  56 y.o.-year-old patient lying in the bed with no acute distress.  EYES: Pupils equal, round, reactive to light and accommodation. No scleral icterus. Extraocular muscles intact.  HEENT: Head atraumatic, normocephalic. Oropharynx and nasopharynx clear.  NECK:  Supple, no jugular venous distention. No thyroid enlargement, no tenderness.  LUNGS: Decreased breath sounds bilaterally, bilateral wheezing noted. No pleural rub noted. CARDIOVASCULAR: S1, S2 normal. No murmurs, rubs, or gallops.  ABDOMEN: Soft, nontender, nondistended. Bowel sounds present. No organomegaly  or mass.  EXTREMITIES: No pedal edema, cyanosis, or clubbing.  NEUROLOGIC: Cranial nerves II through XII are intact. Muscle strength 5/5 in all extremities. Sensation intact. Gait not checked.  PSYCHIATRIC: The patient is alert and oriented x 3.  SKIN: No obvious rash, lesion, or ulcer.   LABORATORY PANEL:   CBC  Recent Labs Lab 02/19/17 0311  WBC 8.0  HGB 13.9  HCT 41.1  PLT 275  MCV 95.4  MCH 32.2  MCHC 33.7  RDW 13.9   ------------------------------------------------------------------------------------------------------------------  Chemistries   Recent Labs Lab 02/19/17 0311  NA 138  K 4.1  CL 103  CO2 27  GLUCOSE 191*  BUN 11  CREATININE 0.78  CALCIUM 9.4   ------------------------------------------------------------------------------------------------------------------ CrCl cannot be calculated (Unknown ideal weight.). ------------------------------------------------------------------------------------------------------------------ No results for input(s): TSH, T4TOTAL, T3FREE, THYROIDAB in the last 72 hours.  Invalid input(s): FREET3   Coagulation profile No results for input(s): INR, PROTIME in the last 168 hours. ------------------------------------------------------------------------------------------------------------------- No results for input(s): DDIMER in the last 72 hours. -------------------------------------------------------------------------------------------------------------------  Cardiac Enzymes  Recent Labs Lab 02/19/17 0311  TROPONINI 0.04*   ------------------------------------------------------------------------------------------------------------------ Invalid input(s): POCBNP  ---------------------------------------------------------------------------------------------------------------  Urinalysis No results found for: COLORURINE, APPEARANCEUR, LABSPEC, PHURINE, GLUCOSEU, HGBUR, BILIRUBINUR, KETONESUR, PROTEINUR,  UROBILINOGEN, NITRITE, LEUKOCYTESUR   RADIOLOGY: Dg Chest Portable 1 View  Result Date: 02/19/2017 CLINICAL DATA:  56 y/o  F; shortness of breath. EXAM: PORTABLE CHEST 1 VIEW COMPARISON:  01/09/2016 chest radiograph. FINDINGS: Stable normal cardiac silhouette given projection and technique. Clear lungs. No pleural effusion or pneumothorax. Mild reverse S curvature of the spine. IMPRESSION: No active disease. Electronically Signed   By: Mitzi Hansen M.D.   On: 02/19/2017 03:34    EKG: Orders placed or performed during the hospital encounter of 02/19/17  . ED EKG within 10 minutes  . ED EKG  . ED EKG within 10 minutes  . ED EKG  . EKG 12-Lead  . EKG 12-Lead    IMPRESSION AND PLAN: 56 year old female patient with history of COPD, hypothyroidism,hypertension presented to the emergency room with increased shortness of breath and wheezing  Admitting diagnosis 1. Acute COPD exacerbation 2.Acute respiratory distress 3. Hypertension 4. Hypothyroidism Treatment plan Admit patient to stepdown unit Continue BiPAP for respiratory distress IV Solu-Medrol 125 MG every 6 hourly Breathing treatments aggressively DVT prophylaxis subcutaneous Lovenox Start patient on IV Rocephin and IV Zithromax antibiotics Intensivist on call notified   All the records are reviewed and case discussed with ED provider. Management plans discussed with the patient, family and they are in agreement.  CODE STATUS:FULL CODE Code Status History    Date Active Date Inactive Code Status Order ID Comments User Context   05/05/2015  4:13 AM 05/05/2015  2:13 PM Full Code [id number]  Milagros Loll, MD ED       TOTAL TIME TAKING CARE OF THIS PATIENT: .    Ihor Austin M.D on 02/19/2017 at 5:22 AM  Between 7am to 6pm - Pager - [phone]  After 6pm go to [email or web address] - password [email or web address] [facility]  [place] [facility]  Office  [phone]  CC: Primary  care physician; Marisue IvanLinthavong,  Kanhka, MD

## 2017-02-19 NOTE — ED Triage Notes (Signed)
Pt arrived to ED via EMS from home with respiratory distress. Per EMS, pt has had cough and congestion x3 days and tonight was unable to catch breath. Pt sat on arrival by EMS was 90% RA, post 3 duo nebs in route and 125 of Solumedrol, pt sat 96 RA. Pt is A&O x4 with bilateral crackles. Pt has HX of COPD and asthma with only rescue albuterol inhalers at home.

## 2017-02-19 NOTE — ED Notes (Signed)
ED Provider at bedside. 

## 2017-02-19 NOTE — Progress Notes (Signed)
Pharmacy Antibiotic Note  Caitlin Waters is a 56 y.o. female admitted on 02/19/2017 with COPD exacerbation.  Pharmacy has been consulted for ceftriaxone dosing.  Plan: Per discussion at rounds, ceftriaxone will be continued today at 1 g IV Q 24 hours. Discontinue azithromycin. Will consider changing patient to doxycycline tomorrow 10/18.  Height: 5\' 8"  (172.7 cm) Weight: 159 lb 6.3 oz (72.3 kg) IBW/kg (Calculated) : 63.9  Temp (24hrs), Avg:97.3 F (36.3 C), Min:96.1 F (35.6 C), Max:98 F (36.7 C)   Recent Labs Lab 02/19/17 0311  WBC 8.0  CREATININE 0.78    Estimated Creatinine Clearance: 81.1 mL/min (by C-G formula based on SCr of 0.78 mg/dL).    Allergies  Allergen Reactions  . Codeine Other (See Comments)    Was prescribed in past cough syrup with codeine that made her "feel funny."    Antimicrobials this admission: Azithromycin 10/17 >> 10/17 Ceftriaxone 10/17 >>   Dose adjustments this admission:   Microbiology results: 10/17 MRSA PCR: negative  Thank you for allowing pharmacy to be a part of this patient's care.  Greer PickerelJessica Jadian Karman 02/19/2017 1:34 PM

## 2017-02-19 NOTE — Progress Notes (Signed)
Transported pt to ICU on Bipap without incident. Report given to receiving RT. 

## 2017-02-19 NOTE — Progress Notes (Signed)
Results for Mardee PostinOTEAT, [name] (MRN [id number]) as of 02/19/2017 11:48  Ref. Range 02/19/2017 06:54  Glucose-Capillary Latest Ref Range: 65 - 99 mg/dL 409245 (H)    Admit with: SOB  History: COPD  NO mention of Diabetes in H&P.      MD- Note patient getting Solumedrol 80 mg BID.  Glucose levels elevated this AM.  Please consider statring Novolog Sensitive Correction Scale/ SSI (0-9 units) TID AC + HS      --Will follow patient during hospitalization--  Ambrose FinlandJeannine Johnston [name] RN, MSN, CDE Diabetes Coordinator Inpatient Glycemic Control Team Team Pager: [phone] (8a-5p)

## 2017-02-19 NOTE — Consult Note (Signed)
Name: Caitlin Waters MRN: [id number] DOB: 28-Oct-1960    ADMISSION DATE:  02/19/2017  CONSULTATION DATE:  02/19/17  REFERRING MD :  Dr.Pyreddy  CHIEF COMPLAINT: Shortness of breath  BRIEF PATIENT DESCRIPTION: 56 year old female with acute on chronic hypoxic/hypercarbic respiratory failure related to COPD exacerbation  requiring BiPAP  SIGNIFICANT EVENTS  10/17 Patient admitted to the SDU on BiPAP  STUDIES:  None   HISTORY OF PRESENT ILLNESS:  Caitlin Waters is a 56 year old female with known history of COPD,Hypertension, Hypothyroidism and Tobacco abuse.  Patient presented to ED on 10/17   PAST MEDICAL HISTORY :   has a past medical history of COPD (chronic obstructive pulmonary disease) (HCC); HTN (hypertension); Thyroid disease; and Tobacco abuse.  has a past surgical history that includes Abdominal hysterectomy. Prior to Admission medications   Medication Sig Start Date End Date Taking? Authorizing Provider  albuterol (PROVENTIL HFA;VENTOLIN HFA) 108 (90 Base) MCG/ACT inhaler Inhale 2-4 puffs by mouth every 4 hours as needed for wheezing, cough, and/or shortness of breath 07/06/15  Yes Loleta Rose, MD  levothyroxine (SYNTHROID, LEVOTHROID) 112 MCG tablet Take 112 mcg by mouth daily before breakfast.    Yes [provider]  loratadine (CLARITIN) 10 MG tablet Take 10 mg by mouth daily.   Yes [provider]  losartan (COZAAR) 25 MG tablet Take 25 mg by mouth daily.   Yes [provider]  PARoxetine (PAXIL) 20 MG tablet Take 20 mg by mouth daily.   Yes [provider]   Allergies  Allergen Reactions  . Codeine Other (See Comments)    Was prescribed in past cough syrup with codeine that made her "feel funny."    FAMILY HISTORY:  family history includes Breast cancer in her maternal grandmother; Dementia in her mother. SOCIAL HISTORY:  reports that she has been smoking Cigarettes.  She has been smoking about 0.10 packs per day. She has  never used smokeless tobacco. She reports that she drinks alcohol. She reports that she does not use drugs.  REVIEW OF SYSTEMS:   Constitutional: Negative for fever, chills, weight loss, malaise/fatigue and diaphoresis.  HENT: Negative for hearing loss, ear pain, nosebleeds, congestion, sore throat, neck pain, tinnitus and ear discharge.   Eyes: Negative for blurred vision, double vision, photophobia, pain, discharge and redness.  Respiratory: Negative for cough, hemoptysis, sputum production, shortness of breath, wheezing and stridor.   Cardiovascular: Negative for chest pain, palpitations, orthopnea, claudication, leg swelling and PND.  Gastrointestinal: Negative for heartburn, nausea, vomiting, abdominal pain, diarrhea, constipation, blood in stool and melena.  Genitourinary: Negative for dysuria, urgency, frequency, hematuria and flank pain.  Musculoskeletal: Negative for myalgias, back pain, joint pain and falls.  Skin: Negative for itching and rash.  Neurological: Negative for dizziness, tingling, tremors, sensory change, speech change, focal weakness, seizures, loss of consciousness, weakness and headaches.  Endo/Heme/Allergies: Negative for environmental allergies and polydipsia. Does not bruise/bleed easily.  SUBJECTIVE: Patient on Bipap with some respiratory distress  VITAL SIGNS: Temp:  [98 F (36.7 C)] 98 F (36.7 C) (10/17 0319) Pulse Rate:  [108-120] 110 (10/17 0600) Resp:  [24-30] 24 (10/17 0600) BP: (120-142)/(78-92) 127/84 (10/17 0600) SpO2:  [92 %-100 %] 98 % (10/17 0600) Weight:  [145 lb (65.8 kg)] 145 lb (65.8 kg) (10/17 0314)  PHYSICAL EXAMINATION: General: 56 year old female on BiPAP,with some  Respiratory distress Neuro:  Awake,alert and oriented HEENT:  Atraumatic,Normocephalic, no jvd Cardiovascular: s1s2,regular,no m/r/g Lungs: wheezes throughout, no crackles,rhonchi noted  Abdomen: soft,NT,ND Musculoskeletal:  No edema,cyanosis Skin:  Warm,dry and  intact   Recent Labs Lab 02/19/17 0311  NA 138  K 4.1  CL 103  CO2 27  BUN 11  CREATININE 0.78  GLUCOSE 191*    Recent Labs Lab 02/19/17 0311  HGB 13.9  HCT 41.1  WBC 8.0  PLT 275   Dg Chest Portable 1 View  Result Date: 02/19/2017 CLINICAL DATA:  56 y/o  F; shortness of breath. EXAM: PORTABLE CHEST 1 VIEW COMPARISON:  01/09/2016 chest radiograph. FINDINGS: Stable normal cardiac silhouette given projection and technique. Clear lungs. No pleural effusion or pneumothorax. Mild reverse S curvature of the spine. IMPRESSION: No active disease. Electronically Signed   By: Mitzi HansenLance  Furusawa-Stratton M.D.   On: 02/19/2017 03:34    ASSESSMENT / PLAN:  Acute on chronic hypoxic /hypercarbic respiratory failure due to COPD exacerbation Tobacco Abuse Hx of Hypertension Hx of Hypothyroidism Elevated troponins secondary to demand ischemia   Plan Continue Bipap, wean as tolerated Continue Steroids, Taper Continue Bronchodilators Tobacco cessation counseling Nicotine Patch ordered Trend troponins Azithromycin for COPD flare up Continue Synthroid Continue Losartan.   Bincy Varughese,AG-ACNP Pulmonary & Critical Care Pulmonary and Critical Care Medicine [facility]  02/19/2017, 6:35 AM   PCCM ATTENDING ATTESTATION:  I have evaluated patient with the APP Varughese, reviewed database in its entirety and discussed care plan in detail. In addition, this patient was discussed on multidisciplinary rounds.   Important exam findings: Mildly tachypneic and labored on [place] O2 Diffuse coarse wheezes Tachycardia, regular No LE edema  Major problems addressed by PCCM team: Smoker Acute respiratory failure due to COPD exacerbation  PLAN/REC: Continue PRN BiPAP Continue supplemental O2 Continue systemic steroids Continue nebulized steroids and bronchodilators Continue empiric antibiotics Continue to monitor in SDU for intermittent BiPAP Counseled regarding smoking  cessation  Billy Fischeravid Tien Spooner, MD PCCM service Mobile [phone] Pager [phone] 02/19/2017 2:09 PM

## 2017-02-20 LAB — BASIC METABOLIC PANEL
ANION GAP: 8 (ref 5–15)
BUN: 12 mg/dL (ref 6–20)
CALCIUM: 9.6 mg/dL (ref 8.9–10.3)
CO2: 26 mmol/L (ref 22–32)
Chloride: 104 mmol/L (ref 101–111)
Creatinine, Ser: 0.91 mg/dL (ref 0.44–1.00)
GLUCOSE: 186 mg/dL — AB (ref 65–99)
POTASSIUM: 4.2 mmol/L (ref 3.5–5.1)
SODIUM: 138 mmol/L (ref 135–145)

## 2017-02-20 LAB — CBC
HCT: 41.2 % (ref 35.0–47.0)
HEMOGLOBIN: 13.7 g/dL (ref 12.0–16.0)
MCH: 31.9 pg (ref 26.0–34.0)
MCHC: 33.3 g/dL (ref 32.0–36.0)
MCV: 95.9 fL (ref 80.0–100.0)
Platelets: 290 10*3/uL (ref 150–440)
RBC: 4.29 MIL/uL (ref 3.80–5.20)
RDW: 13.7 % (ref 11.5–14.5)
WBC: 10.2 10*3/uL (ref 3.6–11.0)

## 2017-02-20 LAB — GLUCOSE, CAPILLARY
GLUCOSE-CAPILLARY: 191 mg/dL — AB (ref 65–99)
Glucose-Capillary: 157 mg/dL — ABNORMAL HIGH (ref 65–99)

## 2017-02-20 LAB — TROPONIN I: TROPONIN I: 0.19 ng/mL — AB (ref <0.03)

## 2017-02-20 LAB — HIV ANTIBODY (ROUTINE TESTING W REFLEX): HIV Screen 4th Generation wRfx: NONREACTIVE

## 2017-02-20 MED ORDER — GUAIFENESIN-DM 100-10 MG/5ML PO SYRP: 5.0000 mL | ORAL_SOLUTION | ORAL | 0 refills | Status: DC | PRN

## 2017-02-20 MED ORDER — PREDNISONE 10 MG PO TABS: ORAL_TABLET | ORAL | 0 refills | Status: DC

## 2017-02-20 MED ORDER — CEFUROXIME AXETIL 500 MG PO TABS
500.0000 mg | ORAL_TABLET | Freq: Two times a day (BID) | ORAL | Status: DC
  Filled 2017-02-20: qty 1

## 2017-02-20 MED ORDER — NICOTINE 7 MG/24HR TD PT24: 7.0000 mg | MEDICATED_PATCH | Freq: Every day | TRANSDERMAL | 0 refills | Status: DC

## 2017-02-20 MED ORDER — ALBUTEROL SULFATE HFA 108 (90 BASE) MCG/ACT IN AERS: INHALATION_SPRAY | RESPIRATORY_TRACT | 2 refills | Status: DC

## 2017-02-20 MED ORDER — PNEUMOCOCCAL VAC POLYVALENT 25 MCG/0.5ML IJ INJ
0.5000 mL | INJECTION | Freq: Once | INTRAMUSCULAR | Status: AC
  Administered 2017-02-20: 0.5 mL via INTRAMUSCULAR

## 2017-02-20 MED ORDER — GUAIFENESIN-DM 100-10 MG/5ML PO SYRP
5.0000 mL | ORAL_SOLUTION | ORAL | Status: DC | PRN
  Administered 2017-02-20: 5 mL via ORAL
  Filled 2017-02-20: qty 5

## 2017-02-20 MED ORDER — PREDNISONE 50 MG PO TABS: 50.0000 mg | ORAL_TABLET | Freq: Every day | ORAL | Status: DC

## 2017-02-20 MED ORDER — CEFUROXIME AXETIL 500 MG PO TABS: 500.0000 mg | ORAL_TABLET | Freq: Two times a day (BID) | ORAL | 0 refills | Status: DC

## 2017-02-20 NOTE — Progress Notes (Addendum)
Inpatient Diabetes Program Recommendations  AACE/ADA: New Consensus Statement on Inpatient Glycemic Control (2015)  Target Ranges:  Prepandial:   less than 140 mg/dL      Peak postprandial:   less than 180 mg/dL (1-2 hours)      Critically ill patients:  140 - 180 mg/dL   Lab Results  Component Value Date   GLUCAP 191 (H) 02/20/2017    Review of Glycemic Control  Diabetes history: none noted Outpatient Diabetes medications: none Current orders for Inpatient glycemic control: Novolog moderate correction tid  * Solumedrol 80 mg BID.    Consider changing diet to carb modified.   Susette RacerJulie Nevaan Bunton, RN, BA, MHA, CDE Diabetes Coordinator Inpatient Diabetes Program  [phone] (Team Pager) [phone] [facility] Office) 02/20/2017 12:29 PM

## 2017-02-20 NOTE — Progress Notes (Signed)
Pt discharged to home via wheelchair accompanied by family member. Prior to d/c all discharge teachings done both written and verbal. Pt verb understanding of all d/c teachings and agree to comply. Pt discharged with prescription for albuterol and note for work. Prior to d/c pt oxy sat was 96% RA. No change in pt from AM assessment. Denies pain on d/c.

## 2017-02-20 NOTE — Progress Notes (Signed)
[facility]    Marylouise StacksWilma Banta was admitted to the Hospital on 02/19/2017 and Discharged  02/20/2017 and should be excused from work/school   for  5 days starting 02/19/2017 , may return to work/school without any restrictions.  Call Enedina FinnerSona Eliyas Suddreth MD, [facility]  [phone] with questions.  Enedina FinnerPATEL,Orlean Holtrop M.D on 02/20/2017,at 1:23 PM  Patient ID: Mardee PostinWilma E Colquhoun, female   DOB: 1961-03-25, 56 y.o.   MRN: [id number]

## 2017-02-20 NOTE — Discharge Summary (Signed)
[facility] [place] [facility]   PATIENT NAME: Caitlin SanderWilma Waters    MR#:  [id number]  DATE OF BIRTH:  1961/03/14  DATE OF ADMISSION:  02/19/2017 ADMITTING PHYSICIAN: Ihor AustinPavan Pyreddy, MD  DATE OF DISCHARGE: 02/20/17  PRIMARY CARE PHYSICIAN: Marisue IvanLinthavong, Kanhka, MD    ADMISSION DIAGNOSIS:  SOB (shortness of breath) [R06.02] Bronchitis [J40] COPD exacerbation (HCC) [J44.1]  DISCHARGE DIAGNOSIS:  Acute on Chronic COPD exacerbation Acute bronchitis Tobacco abuse  SECONDARY DIAGNOSIS:   Past Medical History:  Diagnosis Date  . COPD (chronic obstructive pulmonary disease) (HCC)   . HTN (hypertension)   . Thyroid disease   . Tobacco abuse     HOSPITAL COURSE:  Caitlin Waters a 56 y.o.femalewith a known history of cOPD not on home oxygen, hypertension, hypothyroidism, tobacco abuse presented to the emergency room with increased shortness of breath since last 2 days. Patient has cough and shortness of breath. She has wheezing in both lungs  1. Acute on chronic COPD exacerbation -Now off bipap---off oxygen. No respiratory distress -IV steroids, nebs, inhalers--change to oral steroid -smoking cessation advised  2.Acute Bronchitis Po ceftin  3. Hypertension -losartan  4. Hypothyroidism -cont synthroid  D/c home Pt agreeable CONSULTS OBTAINED:    DRUG ALLERGIES:   Allergies  Allergen Reactions  . Codeine Other (See Comments)    Was prescribed in past cough syrup with codeine that made her "feel funny."    DISCHARGE MEDICATIONS:   Current Discharge Medication List    START taking these medications   Details  cefUROXime (CEFTIN) 500 MG tablet Take 1 tablet (500 mg total) by mouth 2 (two) times daily with a meal. Qty: 12 tablet, Refills: 0    guaiFENesin-dextromethorphan (ROBITUSSIN DM) 100-10 MG/5ML syrup Take 5 mLs by mouth every 4 (four) hours as needed for cough. Qty: 118 mL, Refills: 0    nicotine (NICODERM CQ - DOSED IN  MG/24 HR) 7 mg/24hr patch Place 1 patch (7 mg total) onto the skin daily. Qty: 28 patch, Refills: 0    predniSONE (DELTASONE) 10 MG tablet Take 50 mg daily then Taper by 10 mg daily and stop Qty: 15 tablet, Refills: 0      CONTINUE these medications which have CHANGED   Details  albuterol (PROVENTIL HFA;VENTOLIN HFA) 108 (90 Base) MCG/ACT inhaler Inhale 2-4 puffs by mouth every 4 hours as needed for wheezing, cough, and/or shortness of breath Qty: 1 Inhaler, Refills: 2      CONTINUE these medications which have NOT CHANGED   Details  levothyroxine (SYNTHROID, LEVOTHROID) 112 MCG tablet Take 112 mcg by mouth daily before breakfast.     loratadine (CLARITIN) 10 MG tablet Take 10 mg by mouth daily.    losartan (COZAAR) 25 MG tablet Take 25 mg by mouth daily.    PARoxetine (PAXIL) 20 MG tablet Take 20 mg by mouth daily.        If you experience worsening of your admission symptoms, develop shortness of breath, life threatening emergency, suicidal or homicidal thoughts you must seek medical attention immediately by calling 911 or calling your MD immediately  if symptoms less severe.  You Must read complete instructions/literature along with all the possible adverse reactions/side effects for all the Medicines you take and that have been prescribed to you. Take any new Medicines after you have completely understood and accept all the possible adverse reactions/side effects.   Please note  You were cared for by a hospitalist during your hospital stay. If you have any  questions about your discharge medications or the care you received while you were in the hospital after you are discharged, you can call the unit and asked to speak with the hospitalist on call if the hospitalist that took care of you is not available. Once you are discharged, your primary care physician will handle any further medical issues. Please note that NO REFILLS for any discharge medications will be authorized once you  are discharged, as it is imperative that you return to your primary care physician (or establish a relationship with a primary care physician if you do not have one) for your aftercare needs so that they can reassess your need for medications and monitor your lab values. Today   SUBJECTIVE   Feel a lot better  VITAL SIGNS:  Blood pressure 120/71, pulse 90, temperature 97.7 F (36.5 C), temperature source Oral, resp. rate 16, height 5\' 8"  (1.727 m), weight 72.3 kg (159 lb 6.3 oz), SpO2 98 %.  I/O:   Intake/Output Summary (Last 24 hours) at 02/20/17 1256 Last data filed at 02/19/17 1800  Gross per 24 hour  Intake                0 ml  Output              400 ml  Net             -400 ml    PHYSICAL EXAMINATION:  GENERAL:  56 y.o.-year-old patient lying in the bed with no acute distress.  EYES: Pupils equal, round, reactive to light and accommodation. No scleral icterus. Extraocular muscles intact.  HEENT: Head atraumatic, normocephalic. Oropharynx and nasopharynx clear.  NECK:  Supple, no jugular venous distention. No thyroid enlargement, no tenderness.  LUNGS: Normal breath sounds bilaterally, no wheezing, rales,rhonchi or crepitation. No use of accessory muscles of respiration.  CARDIOVASCULAR: S1, S2 normal. No murmurs, rubs, or gallops.  ABDOMEN: Soft, non-tender, non-distended. Bowel sounds present. No organomegaly or mass.  EXTREMITIES: No pedal edema, cyanosis, or clubbing.  NEUROLOGIC: Cranial nerves II through XII are intact. Muscle strength 5/5 in all extremities. Sensation intact. Gait not checked.  PSYCHIATRIC: The patient is alert and oriented x 3.  SKIN: No obvious rash, lesion, or ulcer.   DATA REVIEW:   CBC   Recent Labs Lab 02/20/17 0326  WBC 10.2  HGB 13.7  HCT 41.2  PLT 290    Chemistries   Recent Labs Lab 02/20/17 0326  NA 138  K 4.2  CL 104  CO2 26  GLUCOSE 186*  BUN 12  CREATININE 0.91  CALCIUM 9.6    Microbiology Results   Recent  Results (from the past 240 hour(s))  MRSA PCR Screening     Status: None   Collection Time: 02/19/17  6:52 AM  Result Value Ref Range Status   MRSA by PCR NEGATIVE NEGATIVE Final    Comment:        The GeneXpert MRSA Assay (FDA approved for NASAL specimens only), is one component of a comprehensive MRSA colonization surveillance program. It is not intended to diagnose MRSA infection nor to guide or monitor treatment for MRSA infections.     RADIOLOGY:  Dg Chest Portable 1 View  Result Date: 02/19/2017 CLINICAL DATA:  56 y/o  F; shortness of breath. EXAM: PORTABLE CHEST 1 VIEW COMPARISON:  01/09/2016 chest radiograph. FINDINGS: Stable normal cardiac silhouette given projection and technique. Clear lungs. No pleural effusion or pneumothorax. Mild reverse S curvature of the spine. IMPRESSION: No  active disease. Electronically Signed   By: Mitzi Hansen M.D.   On: 02/19/2017 03:34     Management plans discussed with the patient, family and they are in agreement.  CODE STATUS:     Code Status Orders        Start     Ordered   02/19/17 0646  Full code  Continuous     02/19/17 0645    Code Status History    Date Active Date Inactive Code Status Order ID Comments User Context   05/05/2015  4:13 AM 05/05/2015  2:13 PM Full Code [id number]  Milagros Loll, MD ED      TOTAL TIME TAKING CARE OF THIS PATIENT: *40* minutes.    Avonelle Viveros M.D on 02/20/2017 at 12:56 PM  Between 7am to 6pm - Pager - [phone] After 6pm go to [email or web address] - Social research officer, government  [facility]  Office  [phone]  CC: Primary care physician; Marisue Ivan, MD

## 2017-05-11 ENCOUNTER — Emergency Department: Payer: BLUE CROSS/BLUE SHIELD

## 2017-05-11 ENCOUNTER — Inpatient Hospital Stay
Admission: EM | Admit: 2017-05-11 | Discharge: 2017-05-12 | DRG: 192 | Disposition: A | Discharge status: Home / Self Care | Payer: BLUE CROSS/BLUE SHIELD | Attending: Internal Medicine | Admitting: Internal Medicine

## 2017-05-11 ENCOUNTER — Other Ambulatory Visit: Payer: Self-pay

## 2017-05-11 DIAGNOSIS — J209 Acute bronchitis, unspecified: Secondary | ICD-10-CM | POA: Diagnosis present

## 2017-05-11 DIAGNOSIS — I10 Essential (primary) hypertension: Secondary | ICD-10-CM | POA: Diagnosis present

## 2017-05-11 DIAGNOSIS — J440 Chronic obstructive pulmonary disease with acute lower respiratory infection: Secondary | ICD-10-CM | POA: Diagnosis present

## 2017-05-11 DIAGNOSIS — J441 Chronic obstructive pulmonary disease with (acute) exacerbation: Principal | ICD-10-CM | POA: Diagnosis present

## 2017-05-11 DIAGNOSIS — T380X5A Adverse effect of glucocorticoids and synthetic analogues, initial encounter: Secondary | ICD-10-CM | POA: Diagnosis present

## 2017-05-11 DIAGNOSIS — Z79899 Other long term (current) drug therapy: Secondary | ICD-10-CM | POA: Diagnosis not present

## 2017-05-11 DIAGNOSIS — Z79890 Hormone replacement therapy (postmenopausal): Secondary | ICD-10-CM | POA: Diagnosis not present

## 2017-05-11 DIAGNOSIS — E039 Hypothyroidism, unspecified: Secondary | ICD-10-CM | POA: Diagnosis present

## 2017-05-11 DIAGNOSIS — F17210 Nicotine dependence, cigarettes, uncomplicated: Secondary | ICD-10-CM | POA: Diagnosis present

## 2017-05-11 DIAGNOSIS — Z90710 Acquired absence of both cervix and uterus: Secondary | ICD-10-CM | POA: Diagnosis not present

## 2017-05-11 DIAGNOSIS — J449 Chronic obstructive pulmonary disease, unspecified: Secondary | ICD-10-CM | POA: Diagnosis present

## 2017-05-11 DIAGNOSIS — R0902 Hypoxemia: Secondary | ICD-10-CM | POA: Diagnosis present

## 2017-05-11 DIAGNOSIS — Z885 Allergy status to narcotic agent status: Secondary | ICD-10-CM

## 2017-05-11 DIAGNOSIS — F329 Major depressive disorder, single episode, unspecified: Secondary | ICD-10-CM | POA: Diagnosis present

## 2017-05-11 DIAGNOSIS — Z716 Tobacco abuse counseling: Secondary | ICD-10-CM | POA: Diagnosis not present

## 2017-05-11 DIAGNOSIS — R739 Hyperglycemia, unspecified: Secondary | ICD-10-CM | POA: Diagnosis present

## 2017-05-11 LAB — BLOOD GAS, VENOUS
PCO2 VEN: 45 mmHg (ref 44.0–60.0)
Patient temperature: 37
pH, Ven: 7.36 (ref 7.250–7.430)
pO2, Ven: 57 mmHg — ABNORMAL HIGH (ref 32.0–45.0)

## 2017-05-11 LAB — CBC
HCT: 37.1 % (ref 35.0–47.0)
Hemoglobin: 13 g/dL (ref 12.0–16.0)
MCH: 33.4 pg (ref 26.0–34.0)
MCHC: 35.1 g/dL (ref 32.0–36.0)
MCV: 95.2 fL (ref 80.0–100.0)
PLATELETS: 286 10*3/uL (ref 150–440)
RBC: 3.9 MIL/uL (ref 3.80–5.20)
RDW: 14.7 % — AB (ref 11.5–14.5)
WBC: 9.8 10*3/uL (ref 3.6–11.0)

## 2017-05-11 LAB — BASIC METABOLIC PANEL
Anion gap: 11 (ref 5–15)
BUN: 21 mg/dL — AB (ref 6–20)
CALCIUM: 9.5 mg/dL (ref 8.9–10.3)
CO2: 23 mmol/L (ref 22–32)
CREATININE: 0.79 mg/dL (ref 0.44–1.00)
Chloride: 104 mmol/L (ref 101–111)
GFR calc Af Amer: >60 mL/min (ref >60)
Glucose, Bld: 153 mg/dL — ABNORMAL HIGH (ref 65–99)
Potassium: 3.6 mmol/L (ref 3.5–5.1)
SODIUM: 138 mmol/L (ref 135–145)

## 2017-05-11 LAB — INFLUENZA PANEL BY PCR (TYPE A & B)
INFLBPCR: NEGATIVE
Influenza A By PCR: NEGATIVE

## 2017-05-11 LAB — LACTIC ACID, PLASMA: Lactic Acid, Venous: 1.9 mmol/L (ref 0.5–1.9)

## 2017-05-11 LAB — TROPONIN I

## 2017-05-11 MED ORDER — SODIUM CHLORIDE 0.9 % IV BOLUS (SEPSIS)
500.0000 mL | Freq: Once | INTRAVENOUS | Status: AC
  Administered 2017-05-11: 500 mL via INTRAVENOUS

## 2017-05-11 MED ORDER — GUAIFENESIN 200 MG PO TABS
400.0000 mg | ORAL_TABLET | Freq: Once | ORAL | Status: DC
  Filled 2017-05-11: qty 2

## 2017-05-11 MED ORDER — ALBUTEROL SULFATE (2.5 MG/3ML) 0.083% IN NEBU
5.0000 mg | INHALATION_SOLUTION | Freq: Once | RESPIRATORY_TRACT | Status: AC
  Administered 2017-05-11: 5 mg via RESPIRATORY_TRACT

## 2017-05-11 MED ORDER — IPRATROPIUM-ALBUTEROL 0.5-2.5 (3) MG/3ML IN SOLN
3.0000 mL | Freq: Once | RESPIRATORY_TRACT | Status: AC
  Administered 2017-05-11: 3 mL via RESPIRATORY_TRACT
  Filled 2017-05-11: qty 3

## 2017-05-11 MED ORDER — GUAIFENESIN ER 600 MG PO TB12
600.0000 mg | ORAL_TABLET | Freq: Once | ORAL | Status: AC
  Administered 2017-05-11: 600 mg via ORAL
  Filled 2017-05-11: qty 1

## 2017-05-11 MED ORDER — METHYLPREDNISOLONE SODIUM SUCC 125 MG IJ SOLR
125.0000 mg | Freq: Once | INTRAMUSCULAR | Status: AC
  Administered 2017-05-11: 125 mg via INTRAVENOUS
  Filled 2017-05-11: qty 2

## 2017-05-11 NOTE — ED Notes (Signed)
Pt up to BR

## 2017-05-11 NOTE — ED Provider Notes (Signed)
[facility] Emergency Department Provider Note ____________________________________________   First MD Initiated Contact with Patient 05/11/17 1735     (approximate)  I have reviewed the triage vital signs and the nursing notes.   HISTORY  Chief Complaint Shortness of Breath    HPI Caitlin Waters is a 57 y.o. female with history of COPD and other past medical history as noted below who presents with shortness of breath, gradual onset, worse in last 2-3 days, and associated with cough productive of white sputum.  Patient denies associated chest pain, fever chills, or leg swelling.  She states that the symptoms are not resolved with her home inhalers.  She was at work today and felt like she could not catch her breath so she came to the emergency department.  She states it feels similar to prior COPD exacerbations.   Past Medical History:  Diagnosis Date  . COPD (chronic obstructive pulmonary disease) (HCC)   . HTN (hypertension)   . Thyroid disease   . Tobacco abuse     Patient Active Problem List   Diagnosis Date Noted  . Acute respiratory failure (HCC) 02/19/2017  . COPD exacerbation (HCC) 05/05/2015    Past Surgical History:  Procedure Laterality Date  . ABDOMINAL HYSTERECTOMY      Prior to Admission medications   Medication Sig Start Date End Date Taking? Authorizing Provider  albuterol (PROVENTIL HFA;VENTOLIN HFA) 108 (90 Base) MCG/ACT inhaler Inhale 2-4 puffs by mouth every 4 hours as needed for wheezing, cough, and/or shortness of breath 02/20/17   Enedina Finner, MD  cefUROXime (CEFTIN) 500 MG tablet Take 1 tablet (500 mg total) by mouth 2 (two) times daily with a meal. 02/20/17   Enedina Finner, MD  guaiFENesin-dextromethorphan [facility] DM) 100-10 MG/5ML syrup Take 5 mLs by mouth every 4 (four) hours as needed for cough. 02/20/17   Enedina Finner, MD  levothyroxine (SYNTHROID, LEVOTHROID) 112 MCG tablet Take 112 mcg by mouth daily before  breakfast.     [provider]  loratadine (CLARITIN) 10 MG tablet Take 10 mg by mouth daily.    [provider]  losartan (COZAAR) 25 MG tablet Take 25 mg by mouth daily.    [provider]  nicotine (NICODERM CQ - DOSED IN MG/24 HR) 7 mg/24hr patch Place 1 patch (7 mg total) onto the skin daily. 02/21/17   Enedina Finner, MD  PARoxetine (PAXIL) 20 MG tablet Take 20 mg by mouth daily.    [provider]  predniSONE (DELTASONE) 10 MG tablet Take 50 mg daily then Taper by 10 mg daily and stop 02/21/17   Enedina Finner, MD    Allergies Codeine  Family History  Problem Relation Age of Onset  . Breast cancer Maternal Grandmother   . Dementia Mother   . COPD Neg Hx   . Hypertension Neg Hx     Social History Social History   Tobacco Use  . Smoking status: Current Every Day Smoker    Packs/day: 0.10    Types: Cigarettes  . Smokeless tobacco: Never Used  Substance Use Topics  . Alcohol use: Yes    Alcohol/week: 1.8 oz    Types: 3 Cans of beer per week  . Drug use: Yes    Types: Marijuana    Review of Systems  Constitutional: No fever. Eyes: No redness. ENT: No sore throat. Cardiovascular: Denies chest pain. Respiratory: Positive for shortness of breath. Gastrointestinal: No nausea, no vomiting.  Genitourinary: Negative for dysuria.  Musculoskeletal:  Positive for back pain. Skin: Negative for rash. Neurological: Negative for headache.   ____________________________________________   PHYSICAL EXAM:  VITAL SIGNS: ED Triage Vitals  Enc Vitals Group     BP 05/11/17 1727 (!) 167/97     Pulse Rate 05/11/17 1727 (!) 114     Resp 05/11/17 1727 (!) 22     Temp 05/11/17 1727 98.7 F (37.1 C)     Temp Source 05/11/17 1727 Oral     SpO2 05/11/17 1723 (!) 89 %     Weight 05/11/17 1724 160 lb (72.6 kg)     Height 05/11/17 1724 5\' 7"  (1.702 m)     Head Circumference --      Peak Flow --      Pain Score 05/11/17 1723 4     Pain Loc --       Pain Edu? --      Excl. in GC? --     Constitutional: Alert and oriented.  Slightly uncomfortable appearing but in no acute distress. Eyes: Conjunctivae are normal.  Head: Atraumatic. Nose: No congestion/rhinnorhea. Mouth/Throat: Mucous membranes are somewhat dry.   Neck: Normal range of motion.  Cardiovascular: Normal rate, regular rhythm. Grossly normal heart sounds.  Good peripheral circulation. Respiratory: Increased respiratory effort.  No acute respiratory distress.  Diffuse wheezing bilaterally. Gastrointestinal: Soft and nontender. No distention.  Genitourinary: No CVA tenderness. Musculoskeletal: No lower extremity edema.  Extremities warm and well perfused.  Neurologic:  Normal speech and language. No gross focal neurologic deficits are appreciated.  Skin:  Skin is warm and dry. No rash noted. Psychiatric: Mood and affect are normal. Speech and behavior are normal.  ____________________________________________   LABS (all labs ordered are listed, but only abnormal results are displayed)  Labs Reviewed  BASIC METABOLIC PANEL - Abnormal; Notable for the following components:      Result Value   Glucose, Bld 153 (*)    BUN 21 (*)    All other components within normal limits  CBC - Abnormal; Notable for the following components:   RDW 14.7 (*)    All other components within normal limits  BLOOD GAS, VENOUS - Abnormal; Notable for the following components:   pO2, Ven 57.0 (*)    All other components within normal limits  TROPONIN I  LACTIC ACID, PLASMA  INFLUENZA PANEL BY PCR (TYPE A & B)   ____________________________________________  EKG  ED ECG REPORT I, Dionne Bucy, the attending physician, personally viewed and interpreted this ECG.  Date: 05/11/2017 EKG Time: 1723 Rate: 109 Rhythm: Sinus tachycardia QRS Axis: normal Intervals: normal ST/T Wave abnormalities: Nonspecific findings Narrative Interpretation: no evidence of acute ischemia; no  significant change when compared to EKG of 02/19/2017  ____________________________________________  RADIOLOGY  CXR: Bronchitic changes, with no focal infiltrate  ____________________________________________   PROCEDURES  Procedure(s) performed: No    Critical Care performed: No ____________________________________________   INITIAL IMPRESSION / ASSESSMENT AND PLAN / ED COURSE  Pertinent labs & imaging results that were available during my care of the patient were reviewed by me and considered in my medical decision making (see chart for details).  57 year old female with past medical history of COPD and other PMH as noted above presents with gradual onset shortness of breath worsened over the last several days.  She states it feels similar to her prior COPD exacerbations.  On exam, the patient is slightly tachycardic and hypoxic with O2 sat in the low 90s on nasal cannula.  She has increased respiratory  effort but is not in acute distress.  There is diffuse wheezing bilaterally, but the remainder the exam is unremarkable.  I reviewed the past medical records in Epic; the patient was most recently admitted to the hospital in October of last year for COPD exacerbation with similar presentation.  Differential to include COPD, acute bronchitis, pneumonia, influenza, versus less likely cardiac cause.  Plan for chest x-ray, nebs and steroid, lab workup including cardiac enzymes, and reassess.  Anticipate likely need for admission given hypoxia.  ----------------------------------------- 10:04 PM on 05/11/2017 -----------------------------------------  Chest x-ray shows no focal infiltrate and patient is flu negative.  Her respiratory symptoms are improved, but her O2 sat is still in the low to mid 90s on nasal cannula and she still has some wheezing.  Given her hypoxia previously, she is appropriate for admission for COPD exacerbation.  I signed the patient out to the hospitalist Dr.  Caryn BeeMaier.  ____________________________________________   FINAL CLINICAL IMPRESSION(S) / ED DIAGNOSES  Final diagnoses:  COPD exacerbation (HCC)  Hypoxia      NEW MEDICATIONS STARTED DURING THIS VISIT:  This SmartLink is deprecated. Use AVSMEDLIST instead to display the medication list for a patient.   Note:  This document was prepared using Dragon voice recognition software and may include unintentional dictation errors.    Dionne BucySiadecki, Shanda Cadotte, MD 05/11/17 2205

## 2017-05-12 ENCOUNTER — Other Ambulatory Visit: Payer: Self-pay

## 2017-05-12 LAB — BASIC METABOLIC PANEL
Anion gap: 12 (ref 5–15)
BUN: 11 mg/dL (ref 6–20)
CALCIUM: 9.4 mg/dL (ref 8.9–10.3)
CO2: 22 mmol/L (ref 22–32)
CREATININE: 0.78 mg/dL (ref 0.44–1.00)
Chloride: 105 mmol/L (ref 101–111)
GFR calc Af Amer: >60 mL/min (ref >60)
GFR calc non Af Amer: >60 mL/min (ref >60)
GLUCOSE: 264 mg/dL — AB (ref 65–99)
Potassium: 4.2 mmol/L (ref 3.5–5.1)
Sodium: 139 mmol/L (ref 135–145)

## 2017-05-12 LAB — CBC
HCT: 36.9 % (ref 35.0–47.0)
Hemoglobin: 12.5 g/dL (ref 12.0–16.0)
MCH: 32.7 pg (ref 26.0–34.0)
MCHC: 33.8 g/dL (ref 32.0–36.0)
MCV: 96.5 fL (ref 80.0–100.0)
PLATELETS: 257 10*3/uL (ref 150–440)
RBC: 3.83 MIL/uL (ref 3.80–5.20)
RDW: 14.8 % — AB (ref 11.5–14.5)
WBC: 7.4 10*3/uL (ref 3.6–11.0)

## 2017-05-12 LAB — HEMOGLOBIN A1C
HEMOGLOBIN A1C: 6.2 % — AB (ref 4.8–5.6)
Mean Plasma Glucose: 131.24 mg/dL

## 2017-05-12 MED ORDER — PAROXETINE HCL 20 MG PO TABS
20.0000 mg | ORAL_TABLET | Freq: Every day | ORAL | Status: DC
  Administered 2017-05-12: 20 mg via ORAL
  Filled 2017-05-12: qty 1

## 2017-05-12 MED ORDER — LOSARTAN POTASSIUM 25 MG PO TABS
25.0000 mg | ORAL_TABLET | Freq: Every day | ORAL | Status: DC
  Administered 2017-05-12: 25 mg via ORAL
  Filled 2017-05-12: qty 1

## 2017-05-12 MED ORDER — ACETAMINOPHEN 325 MG PO TABS: 650.0000 mg | ORAL_TABLET | Freq: Four times a day (QID) | ORAL | Status: DC | PRN

## 2017-05-12 MED ORDER — TRAZODONE HCL 50 MG PO TABS: 25.0000 mg | ORAL_TABLET | Freq: Every evening | ORAL | Status: DC | PRN

## 2017-05-12 MED ORDER — ORAL CARE MOUTH RINSE: 15.0000 mL | Freq: Two times a day (BID) | OROMUCOSAL | Status: DC

## 2017-05-12 MED ORDER — TIOTROPIUM BROMIDE MONOHYDRATE 18 MCG IN CAPS
18.0000 ug | ORAL_CAPSULE | Freq: Every day | RESPIRATORY_TRACT | Status: DC
  Filled 2017-05-12: qty 5

## 2017-05-12 MED ORDER — HEPARIN SODIUM (PORCINE) 5000 UNIT/ML IJ SOLN
5000.0000 [IU] | Freq: Three times a day (TID) | INTRAMUSCULAR | Status: DC
  Administered 2017-05-12: 5000 [IU] via SUBCUTANEOUS
  Filled 2017-05-12: qty 1

## 2017-05-12 MED ORDER — AZITHROMYCIN 250 MG PO TABS: ORAL_TABLET | ORAL | 0 refills | Status: DC

## 2017-05-12 MED ORDER — BUDESONIDE-FORMOTEROL FUMARATE 80-4.5 MCG/ACT IN AERO: 2.0000 | INHALATION_SPRAY | Freq: Two times a day (BID) | RESPIRATORY_TRACT | 0 refills | Status: DC

## 2017-05-12 MED ORDER — PREDNISONE 20 MG PO TABS: ORAL_TABLET | ORAL | 0 refills | Status: DC

## 2017-05-12 MED ORDER — HYDROCODONE-ACETAMINOPHEN 5-325 MG PO TABS: 1.0000 | ORAL_TABLET | ORAL | Status: DC | PRN

## 2017-05-12 MED ORDER — DOCUSATE SODIUM 100 MG PO CAPS
100.0000 mg | ORAL_CAPSULE | Freq: Two times a day (BID) | ORAL | Status: DC
  Filled 2017-05-12: qty 1

## 2017-05-12 MED ORDER — GUAIFENESIN-DM 100-10 MG/5ML PO SYRP: 5.0000 mL | ORAL_SOLUTION | ORAL | Status: DC | PRN

## 2017-05-12 MED ORDER — BISACODYL 5 MG PO TBEC: 5.0000 mg | DELAYED_RELEASE_TABLET | Freq: Every day | ORAL | Status: DC | PRN

## 2017-05-12 MED ORDER — IPRATROPIUM-ALBUTEROL 0.5-2.5 (3) MG/3ML IN SOLN
3.0000 mL | Freq: Four times a day (QID) | RESPIRATORY_TRACT | Status: DC
  Administered 2017-05-12 (×3): 3 mL via RESPIRATORY_TRACT
  Filled 2017-05-12 (×3): qty 3

## 2017-05-12 MED ORDER — TIOTROPIUM BROMIDE MONOHYDRATE 18 MCG IN CAPS: 18.0000 ug | ORAL_CAPSULE | Freq: Every day | RESPIRATORY_TRACT | 0 refills | Status: DC

## 2017-05-12 MED ORDER — METHYLPREDNISOLONE SODIUM SUCC 125 MG IJ SOLR
80.0000 mg | Freq: Three times a day (TID) | INTRAMUSCULAR | Status: DC
  Administered 2017-05-12 (×2): 80 mg via INTRAVENOUS
  Filled 2017-05-12 (×2): qty 2

## 2017-05-12 MED ORDER — ONDANSETRON HCL 4 MG PO TABS: 4.0000 mg | ORAL_TABLET | Freq: Four times a day (QID) | ORAL | Status: DC | PRN

## 2017-05-12 MED ORDER — DEXTROSE 5 % IV SOLN
1.0000 g | INTRAVENOUS | Status: DC
  Administered 2017-05-12: 1 g via INTRAVENOUS
  Filled 2017-05-12 (×2): qty 10

## 2017-05-12 MED ORDER — LEVOTHYROXINE SODIUM 25 MCG PO TABS
125.0000 ug | ORAL_TABLET | Freq: Every day | ORAL | Status: DC
  Administered 2017-05-12: 125 ug via ORAL
  Filled 2017-05-12: qty 1

## 2017-05-12 MED ORDER — ALBUTEROL SULFATE HFA 108 (90 BASE) MCG/ACT IN AERS: INHALATION_SPRAY | RESPIRATORY_TRACT | 0 refills | Status: DC

## 2017-05-12 MED ORDER — DEXTROSE 5 % IV SOLN
500.0000 mg | INTRAVENOUS | Status: DC
  Administered 2017-05-12: 500 mg via INTRAVENOUS
  Filled 2017-05-12: qty 500

## 2017-05-12 MED ORDER — ONDANSETRON HCL 4 MG/2ML IJ SOLN: 4.0000 mg | Freq: Four times a day (QID) | INTRAMUSCULAR | Status: DC | PRN

## 2017-05-12 MED ORDER — LORATADINE 10 MG PO TABS
10.0000 mg | ORAL_TABLET | Freq: Every day | ORAL | Status: DC
  Administered 2017-05-12: 10 mg via ORAL
  Filled 2017-05-12: qty 1

## 2017-05-12 MED ORDER — ACETAMINOPHEN 650 MG RE SUPP: 650.0000 mg | Freq: Four times a day (QID) | RECTAL | Status: DC | PRN

## 2017-05-12 NOTE — ED Notes (Signed)
Transport patient to floor 2A - 250.AS

## 2017-05-12 NOTE — Progress Notes (Signed)
Patient arrived to 2A Room 250. Patient denies pain and all questions answered. Patient oriented to unit and use of call bell/room phone. Skin assessment completed with Crystal RN and skin intact. A&Ox4, VSS, and NSR/ST on verified tele-box #[id number]. Husband at bedside. Nursing staff will continue to monitor for any changes in patient status. Lamonte RicherKara A Abrial Arrighi, RN

## 2017-05-12 NOTE — Progress Notes (Signed)
Went over discharge instructions with the patient including medications and follow-up appointment. Discontinue peripheral IV and telemetry monitor. Advertising account plannerCalled volunteer for transport.

## 2017-05-12 NOTE — H&P (Signed)
[facility] [place] at [facility]   PATIENT NAME: Caitlin Waters    MR#:  [id number]  DATE OF BIRTH:  1960-05-16  DATE OF ADMISSION:  05/11/2017  PRIMARY CARE PHYSICIAN: Marisue IvanLinthavong, Kanhka, MD   REQUESTING/REFERRING PHYSICIAN:   CHIEF COMPLAINT:   Chief Complaint  Patient presents with  . Shortness of Breath    HISTORY OF PRESENT ILLNESS: Caitlin Waters  is a 57 y.o. female with a known history of COPD, hypertension, thyroid disease and tobacco abuse. Patient came to emergency room for acute onset of shortness of breath going on for the past 3 days gradually getting worse.  Productive cough with yellow sputum and wheezing associated.  Patient had similar symptoms with COPD exacerbation episodes in the past.  No fever, chills, chest pain.  No nausea, vomiting, abdominal pain or diarrhea. 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  At the arrival to emergency room oxygen saturation was 89%.  Patient currently feels better after nebulizer treatment and IV steroids, but she continues to require 2 L oxygen per nasal cannula.  Chest x-ray shows bronchitis, no infiltrates.  Influenza test is negative. Patient is admitted for further evaluation and treatment.   PAST MEDICAL HISTORY:   Past Medical History:  Diagnosis Date  . COPD (chronic obstructive pulmonary disease) (HCC)   . HTN (hypertension)   . Thyroid disease   . Tobacco abuse     PAST SURGICAL HISTORY:  Past Surgical History:  Procedure Laterality Date  . ABDOMINAL HYSTERECTOMY      SOCIAL HISTORY:  Social History   Tobacco Use  . Smoking status: Current Every Day Smoker    Packs/day: 0.10    Types: Cigarettes  . Smokeless tobacco: Never Used  Substance Use Topics  . Alcohol use: Yes    Alcohol/week: 1.8 oz    Types: 3 Cans of beer per week    FAMILY HISTORY:  Family History  Problem Relation Age of Onset  . Breast cancer Maternal  Grandmother   . Dementia Mother   . COPD Neg Hx   . Hypertension Neg Hx     DRUG ALLERGIES:  Allergies  Allergen Reactions  . Codeine Other (See Comments)    Was prescribed in past cough syrup with codeine that made her "feel funny."    REVIEW OF SYSTEMS:   CONSTITUTIONAL: No fever. She c/o fatigue and generalized weakness.  EYES: No blurred or double vision.  EARS, NOSE, AND THROAT: No tinnitus or ear pain.  RESPIRATORY: Pt c/o cough, shortness of breath, wheezing; no hemoptysis.  CARDIOVASCULAR: No chest pain, orthopnea, edema.  GASTROINTESTINAL: No nausea, vomiting, diarrhea or abdominal pain.  GENITOURINARY: No dysuria, hematuria.  ENDOCRINE: No polyuria, nocturia,  HEMATOLOGY: No anemia, easy bruising or bleeding SKIN: No rash or lesion. MUSCULOSKELETAL: No joint pain or arthritis.   NEUROLOGIC: No focal weakness.  PSYCHIATRY: No anxiety or depression.   MEDICATIONS AT HOME:  Prior to Admission medications   Medication Sig Start Date End Date Taking? Authorizing Provider  albuterol (PROVENTIL HFA;VENTOLIN HFA) 108 (90 Base) MCG/ACT inhaler Inhale 2-4 puffs by mouth every 4 hours as needed for wheezing, cough, and/or shortness of breath 02/20/17  Yes Enedina FinnerPatel, Sona, MD  cetirizine (ZYRTEC) 10 MG tablet Take 10 mg by mouth daily.   Yes [provider]  levothyroxine (SYNTHROID, LEVOTHROID) 125 MCG tablet Take 125 mcg by mouth daily before breakfast.  Yes [provider]  losartan (COZAAR) 25 MG tablet Take 25 mg by mouth daily.   Yes [provider]  PARoxetine (PAXIL) 20 MG tablet Take 20 mg by mouth daily.   Yes [provider]  cefUROXime (CEFTIN) 500 MG tablet Take 1 tablet (500 mg total) by mouth 2 (two) times daily with a meal. Patient not taking: Reported on 05/11/2017 02/20/17   Enedina Finner, MD  guaiFENesin-dextromethorphan (ROBITUSSIN DM) 100-10 MG/5ML syrup Take 5 mLs by mouth every 4 (four) hours as needed for cough. Patient not  taking: Reported on 05/11/2017 02/20/17   Enedina Finner, MD  loratadine (CLARITIN) 10 MG tablet Take 10 mg by mouth daily.    [provider]  nicotine (NICODERM CQ - DOSED IN MG/24 HR) 7 mg/24hr patch Place 1 patch (7 mg total) onto the skin daily. Patient not taking: Reported on 05/11/2017 02/21/17   Enedina Finner, MD  predniSONE (DELTASONE) 10 MG tablet Take 50 mg daily then Taper by 10 mg daily and stop Patient not taking: Reported on 05/11/2017 02/21/17   Enedina Finner, MD      PHYSICAL EXAMINATION:   VITAL SIGNS: Blood pressure 140/72, pulse (!) 104, temperature 98.7 F (37.1 C), temperature source Oral, resp. rate 20, height 5\' 7"  (1.702 m), weight 72.6 kg (160 lb), SpO2 93 %.  GENERAL:  57 y.o.-year-old patient lying in the bed, in mild distress secondary to shortness of breath and cough.  EYES: Pupils equal, round, reactive to light and accommodation. No scleral icterus. Extraocular muscles intact.  HEENT: Head atraumatic, normocephalic. Oropharynx and nasopharynx clear.  NECK:  Supple, no jugular venous distention. No thyroid enlargement, no tenderness.  LUNGS: Reduced breath sounds and wheezing bilaterally.  Currently, no use of accessory muscles of respiration.  CARDIOVASCULAR: S1, S2 normal. No S3/S4.  ABDOMEN: Soft, nontender, nondistended. Bowel sounds present. No organomegaly or mass.  EXTREMITIES: No pedal edema, cyanosis, or clubbing.  NEUROLOGIC: No focal weakness. Gait not checked.  PSYCHIATRIC: The patient is alert and oriented x 3.  SKIN: No obvious rash, lesion, or ulcer.   LABORATORY PANEL:   CBC Recent Labs  Lab 05/11/17 1726  WBC 9.8  HGB 13.0  HCT 37.1  PLT 286  MCV 95.2  MCH 33.4  MCHC 35.1  RDW 14.7*   ------------------------------------------------------------------------------------------------------------------  Chemistries  Recent Labs  Lab 05/11/17 1726  NA 138  K 3.6  CL 104  CO2 23  GLUCOSE 153*  BUN 21*  CREATININE 0.79  CALCIUM  9.5   ------------------------------------------------------------------------------------------------------------------ estimated creatinine clearance is 77.3 mL/min (by C-G formula based on SCr of 0.79 mg/dL). ------------------------------------------------------------------------------------------------------------------ No results for input(s): TSH, T4TOTAL, T3FREE, THYROIDAB in the last 72 hours.  Invalid input(s): FREET3   Coagulation profile No results for input(s): INR, PROTIME in the last 168 hours. ------------------------------------------------------------------------------------------------------------------- No results for input(s): DDIMER in the last 72 hours. -------------------------------------------------------------------------------------------------------------------  Cardiac Enzymes Recent Labs  Lab 05/11/17 1726  TROPONINI <0.03   ------------------------------------------------------------------------------------------------------------------ Invalid input(s): POCBNP  ---------------------------------------------------------------------------------------------------------------  Urinalysis No results found for: COLORURINE, APPEARANCEUR, LABSPEC, PHURINE, GLUCOSEU, HGBUR, BILIRUBINUR, KETONESUR, PROTEINUR, UROBILINOGEN, NITRITE, LEUKOCYTESUR   RADIOLOGY: Dg Chest 2 View  Result Date: 05/11/2017 CLINICAL DATA:  SOB, productive cough, and congestion x 2 days. Hx - COPD, HTN, current smoker 0.1 ppd EXAM: CHEST  2 VIEW COMPARISON:  02/19/2017 FINDINGS: Lungs are hyperinflated. There is perihilar peribronchial thickening. There are no focal consolidations or pleural effusions. No pulmonary edema. IMPRESSION: Hyperinflation and bronchitic changes. Electronically Signed   By:  Norva Pavlov M.D.   On: 05/11/2017 18:23    EKG: Orders placed or performed during the hospital encounter of 05/11/17  . ED EKG within 10 minutes  . ED EKG within 10 minutes  . EKG  12-Lead  . EKG 12-Lead    IMPRESSION AND PLAN:  1.  Acute respiratory failure with hypoxemia, secondary to acute COPD exacerbation and acute bronchitis.  We will start oxygen treatment and nebulizer treatment steroids and antibiotics.  We will continue to monitor clinically closely. 2.  Acute COPD exacerbation, treatment as above. 3.  Acute bronchitis, treatment as above, under #1. 4.  Tobacco abuse.  Smoking cessation was discussed with patient in detail. 5.  Hypertension, stable, restart home medications.  All the records are reviewed and case discussed with ED provider. Management plans discussed with the patient, family and they are in agreement.  CODE STATUS:    Code Status Orders  (From admission, onward)        Start     Ordered   05/12/17 0017  Full code  Continuous     05/12/17 0016    Code Status History    Date Active Date Inactive Code Status Order ID Comments User Context   02/19/2017 06:46 02/20/2017 19:02 Full Code [id number]  Ihor Austin, MD ED   05/05/2015 04:13 05/05/2015 14:13 Full Code [id number]  Milagros Loll, MD ED       TOTAL TIME TAKING CARE OF THIS PATIENT: 35 minutes.    Cammy Copa M.D on 05/12/2017 at 1:53 AM  Between 7am to 6pm - Pager - [phone]  After 6pm go to [email or web address] - password [email or web address] [email or web address]  [facility]  Office  [phone]  CC: Primary care physician; Marisue Ivan, MD

## 2017-05-12 NOTE — Progress Notes (Signed)
Inpatient Diabetes Program Recommendations  AACE/ADA: New Consensus Statement on Inpatient Glycemic Control (2015)  Target Ranges:  Prepandial:   less than 140 mg/dL      Peak postprandial:   less than 180 mg/dL (1-2 hours)      Critically ill patients:  140 - 180 mg/dL   Results for Caitlin Waters, Caitlin Waters (MRN [id number]) as of 05/12/2017 12:36  Ref. Range 05/11/2017 17:26 05/12/2017 04:33  Glucose Latest Ref Range: 65 - 99 mg/dL 409153 (H) 811264 (H)    Admit with: COPD  NO History of DM noted.      MD- Note patient getting Solumedrol 80 mg Q8 hours.  Lab glucose elevated to 264 mg/dl this AM.  NO History of DM noted in H&P.  Please consider placing orders for Novolog Sensitive Correction Scale/ SSI (0-9 units) TID AC + HS while patient getting steroids.       --Will follow patient during hospitalization--  Ambrose FinlandJeannine Johnston Nixon Kolton RN, MSN, CDE Diabetes Coordinator Inpatient Glycemic Control Team Team Pager: [phone] (8a-5p)

## 2017-05-12 NOTE — Discharge Summary (Signed)
[facility] - [place] at [facility]   PATIENT NAME: Caitlin Waters    MR#:  [id number]  DATE OF BIRTH:  1961-01-18  DATE OF ADMISSION:  05/11/2017 ADMITTING PHYSICIAN: Cammy Copa, MD  DATE OF DISCHARGE: 05/12/2017  1:53 PM  PRIMARY CARE PHYSICIAN: Marisue Ivan, MD    ADMISSION DIAGNOSIS:  Hypoxia [R09.02] COPD exacerbation (HCC) [J44.1]  DISCHARGE DIAGNOSIS:  Active Problems:   COPD with hypoxia (HCC)   SECONDARY DIAGNOSIS:   Past Medical History:  Diagnosis Date  . COPD (chronic obstructive pulmonary disease) (HCC)   . HTN (hypertension)   . Thyroid disease   . Tobacco abuse     HOSPITAL COURSE:   1.  Acute hypoxic respiratory failure.  The patient had one pulse ox of 89%.  I took the patient off oxygen and walked around and pulse ox was 93%. 2.  COPD exacerbation.  Patient was started on steroids.  When I saw her in the morning she had poor air entry but she wanted to go home.  I reevaluated her in the afternoon and she was moving a little bit better air and wanted to go home.  I prescribed prednisone for another few days, Zithromax, Symbicort, albuterol inhaler and Spiriva. 3.  Elevated glucose being on steroids.  I added on a hemoglobin A1c to the morning labs but this takes time to come back.  Follow-up as outpatient. 4.  Tobacco abuse.  Patient states the nicotine patch bothers her.  Discontinue nicotine patch.  Smoking cessation discussed 4 minutes by me. 5.  Hypothyroidism unspecified on levothyroxine 6.  Essential hypertension on losartan 7.  Depression on Paxil  DISCHARGE CONDITIONS:   Satisfactory  CONSULTS OBTAINED:  None  DRUG ALLERGIES:   Allergies  Allergen Reactions  . Codeine Other (See Comments)    Was prescribed in past cough syrup with codeine that made her "feel funny."    DISCHARGE MEDICATIONS:   Allergies as of 05/12/2017      Reactions   Codeine Other (See Comments)   Was prescribed in past cough syrup with  codeine that made her "feel funny."      Medication List    STOP taking these medications   cefUROXime 500 MG tablet Commonly known as:  CEFTIN   guaiFENesin-dextromethorphan 100-10 MG/5ML syrup Commonly known as:  ROBITUSSIN DM   nicotine 7 mg/24hr patch Commonly known as:  NICODERM CQ - dosed in mg/24 hr     TAKE these medications   albuterol 108 (90 Base) MCG/ACT inhaler Commonly known as:  PROVENTIL HFA;VENTOLIN HFA Inhale 2 puffs by mouth every 4 hours as needed for wheezing, cough, and/or shortness of breath What changed:  additional instructions   azithromycin 250 MG tablet Commonly known as:  ZITHROMAX One tab po daily for 4 days   budesonide-formoterol 80-4.5 MCG/ACT inhaler Commonly known as:  SYMBICORT Inhale 2 puffs into the lungs 2 (two) times daily.   cetirizine 10 MG tablet Commonly known as:  ZYRTEC Take 10 mg by mouth daily.   levothyroxine 125 MCG tablet Commonly known as:  SYNTHROID, LEVOTHROID Take 125 mcg by mouth daily before breakfast.   loratadine 10 MG tablet Commonly known as:  CLARITIN Take 10 mg by mouth daily.   losartan 25 MG tablet Commonly known as:  COZAAR Take 25 mg by mouth daily.   PARoxetine 20 MG tablet Commonly known as:  PAXIL Take 20 mg by mouth daily.   predniSONE 20 MG tablet Commonly known as:  DELTASONE  2 tabs po daily for 4 days What changed:    medication strength  additional instructions   tiotropium 18 MCG inhalation capsule Commonly known as:  SPIRIVA Place 1 capsule (18 mcg total) into inhaler and inhale daily.        DISCHARGE INSTRUCTIONS:   Follow-up PMD 1 week  If you experience worsening of your admission symptoms, develop shortness of breath, life threatening emergency, suicidal or homicidal thoughts you must seek medical attention immediately by calling 911 or calling your MD immediately  if symptoms less severe.  You Must read complete instructions/literature along with all the possible  adverse reactions/side effects for all the Medicines you take and that have been prescribed to you. Take any new Medicines after you have completely understood and accept all the possible adverse reactions/side effects.   Please note  You were cared for by a hospitalist during your hospital stay. If you have any questions about your discharge medications or the care you received while you were in the hospital after you are discharged, you can call the unit and asked to speak with the hospitalist on call if the hospitalist that took care of you is not available. Once you are discharged, your primary care physician will handle any further medical issues. Please note that NO REFILLS for any discharge medications will be authorized once you are discharged, as it is imperative that you return to your primary care physician (or establish a relationship with a primary care physician if you do not have one) for your aftercare needs so that they can reassess your need for medications and monitor your lab values.    Today   CHIEF COMPLAINT:   Chief Complaint  Patient presents with  . Shortness of Breath    HISTORY OF PRESENT ILLNESS:  Caitlin Waters  is a 57 y.o. female came in with shortness of breath   VITAL SIGNS:  Blood pressure 134/66, pulse 97, temperature 98.3 F (36.8 C), temperature source Oral, resp. rate 16, height 5\' 7"  (1.702 m), weight 71 kg (156 lb 9.6 oz), SpO2 99 %.    PHYSICAL EXAMINATION:  GENERAL:  57 y.o.-year-old patient lying in the bed with no acute distress.  EYES: Pupils equal, round, reactive to light and accommodation. No scleral icterus. Extraocular muscles intact.  HEENT: Head atraumatic, normocephalic. Oropharynx and nasopharynx clear.  NECK:  Supple, no jugular venous distention. No thyroid enlargement, no tenderness.  LUNGS: Decreased breath sounds bilaterally, no wheezing, rales,rhonchi or crepitation. No use of accessory muscles of respiration.  CARDIOVASCULAR:  S1, S2 normal. No murmurs, rubs, or gallops.  ABDOMEN: Soft, non-tender, non-distended. Bowel sounds present. No organomegaly or mass.  EXTREMITIES: No pedal edema, cyanosis, or clubbing.  NEUROLOGIC: Cranial nerves II through XII are intact. Muscle strength 5/5 in all extremities. Sensation intact. Gait not checked.  PSYCHIATRIC: The patient is alert and oriented x 3.  SKIN: No obvious rash, lesion, or ulcer.   DATA REVIEW:   CBC Recent Labs  Lab 05/12/17 0433  WBC 7.4  HGB 12.5  HCT 36.9  PLT 257    Chemistries  Recent Labs  Lab 05/12/17 0433  NA 139  K 4.2  CL 105  CO2 22  GLUCOSE 264*  BUN 11  CREATININE 0.78  CALCIUM 9.4    Cardiac Enzymes Recent Labs  Lab 05/11/17 1726  TROPONINI <0.03    Microbiology Results  Results for orders placed or performed during the hospital encounter of 02/19/17  MRSA PCR Screening  Status: None   Collection Time: 02/19/17  6:52 AM  Result Value Ref Range Status   MRSA by PCR NEGATIVE NEGATIVE Final    Comment:        The GeneXpert MRSA Assay (FDA approved for NASAL specimens only), is one component of a comprehensive MRSA colonization surveillance program. It is not intended to diagnose MRSA infection nor to guide or monitor treatment for MRSA infections.     RADIOLOGY:  Dg Chest 2 View  Result Date: 05/11/2017 CLINICAL DATA:  SOB, productive cough, and congestion x 2 days. Hx - COPD, HTN, current smoker 0.1 ppd EXAM: CHEST  2 VIEW COMPARISON:  02/19/2017 FINDINGS: Lungs are hyperinflated. There is perihilar peribronchial thickening. There are no focal consolidations or pleural effusions. No pulmonary edema. IMPRESSION: Hyperinflation and bronchitic changes. Electronically Signed   By: Norva Pavlov M.D.   On: 05/11/2017 18:23    Management plans discussed with the patient, family and they are in agreement.  CODE STATUS:     Code Status Orders  (From admission, onward)        Start     Ordered    05/12/17 0017  Full code  Continuous     05/12/17 0016    Code Status History    Date Active Date Inactive Code Status Order ID Comments User Context   02/19/2017 06:46 02/20/2017 19:02 Full Code [id number]  Ihor Austin, MD ED   05/05/2015 04:13 05/05/2015 14:13 Full Code [id number]  Milagros Loll, MD ED      TOTAL TIME TAKING CARE OF THIS PATIENT: 35 minutes.    Alford Highland M.D on 05/12/2017 at 2:33 PM  Between 7am to 6pm - Pager - [phone]  After 6pm go to [email or web address] - password [email or web address]  [facility] Office  [phone]  CC: Primary care physician; Marisue Ivan, MD

## 2017-05-12 NOTE — Care Management (Signed)
No discharge needs identified by members of the care team 

## 2017-05-12 NOTE — Discharge Instructions (Signed)
Chronic Obstructive Pulmonary Disease Exacerbation Chronic obstructive pulmonary disease (COPD) is a long-term (chronic) condition that affects the lungs. COPD is a general term that can be used to describe many different lung problems that cause lung swelling (inflammation) and limit airflow, including chronic bronchitis and emphysema. COPD exacerbations are episodes when breathing symptoms become much worse and require extra treatment. COPD exacerbations are usually caused by infections. Without treatment, COPD exacerbations can be severe and even life threatening. Frequent COPD exacerbations can cause further damage to the lungs. What are the causes? This condition may be caused by:  Respiratory infections, including viral and bacterial infections.  Exposure to smoke.  Exposure to air pollution, chemical fumes, or dust.  Things that give you an allergic reaction (allergens).  Not taking your usual COPD medicines as directed.  Underlying medical problems, such as congestive heart failure or infections not involving the lungs.  In many cases, the cause (trigger) of this condition is not known. What increases the risk? The following factors may make you more likely to develop this condition:  Smoking cigarettes.  Old age.  Frequent prior COPD exacerbations.  What are the signs or symptoms? Symptoms of this condition include:  Increased coughing.  Increased production of mucus from your lungs (sputum).  Increased wheezing.  Increased shortness of breath.  Rapid or labored breathing.  Chest tightness.  Less energy than usual.  Sleep disruption from symptoms.  Confusion or increased sleepiness.  Often these symptoms happen or get worse even with the use of medicines. How is this diagnosed? This condition is diagnosed based on:  Your medical history.  A physical exam.  You may also have tests, including:  A chest X-ray.  Blood tests.  Lung (pulmonary)  function tests.  How is this treated? Treatment for this condition depends on the severity and cause of the symptoms. You may need to be admitted to a hospital for treatment. Some of the treatments commonly used to treat COPD exacerbations are:  Antibiotic medicines. These may be used for severe exacerbations caused by a lung infection, such as pneumonia.  Bronchodilators. These are inhaled medicines that expand the air passages and allow increased airflow.  Steroid medicines. These act to reduce inflammation in the airways. They may be given with an inhaler, taken by mouth, or given through an IV tube inserted into one of your veins.  Supplemental oxygen therapy.  Airway clearing techniques, such as noninvasive ventilation (NIV) and positive expiratory pressure (PEP). These provide respiratory support through a mask or other noninvasive device. An example of this would be using a continuous positive airway pressure (CPAP) machine to improve delivery of oxygen into your lungs.  Follow these instructions at home: Medicines  Take over-the-counter and prescription medicines only as told by your health care provider. It is important to use correct technique with inhaled medicines.  If you were prescribed an antibiotic medicine or oral steroid, take it as told by your health care provider. Do not stop taking the medicine even if you start to feel better. Lifestyle  Eat a healthy diet.  Exercise regularly.  Get plenty of sleep.  Avoid exposure to all substances that irritate the airway, especially to tobacco smoke.  Wash your hands often with soap and water to reduce the risk of infection. If soap and water are not available, use hand sanitizer.  During flu season, avoid enclosed spaces that are crowded with people. General instructions  Drink enough fluid to keep your urine clear or pale yellow (  unless you have a medical condition that requires fluid restriction).  Use a cool mist  vaporizer. This humidifies the air and makes it easier for you to clear your chest when you cough.  If you have a home nebulizer and oxygen, continue to use them as told by your health care provider.  Keep all follow-up visits as told by your health care provider. This is important. How is this prevented?  Stay up-to-date on pneumococcal and influenza (flu) vaccines. A flu shot is recommended every year to help prevent exacerbations.  Do not use any products that contain nicotine or tobacco, such as cigarettes and e-cigarettes. Quitting smoking is very important in preventing COPD from getting worse and in preventing exacerbations from happening as often. If you need help quitting, ask your health care provider.  Follow all instructions for pulmonary rehabilitation after a recent exacerbation. This can help prevent future exacerbations.  Work with your health care provider to develop and follow an action plan. This tells you what steps to take when you experience certain symptoms. Contact a health care provider if:  You have a worsening of your regular COPD symptoms. Get help right away if:  You have worsening shortness of breath, even when resting.  You have trouble talking.  You have severe chest pain.  You cough up blood.  You have a fever.  You have weakness, vomit repeatedly, or faint.  You feel confused.  You are not able to sleep because of your symptoms.  You have trouble doing daily activities. Summary  COPD exacerbations are episodes when breathing symptoms become much worse and require extra treatment above your normal treatment.  Exacerbations can be severe and even life threatening. Frequent COPD exacerbations can cause further damage to your lungs.  COPD exacerbations are usually triggered by infections such as the flu, colds, and even pneumonia.  Treatment for this condition depends on the severity and cause of the symptoms. You may need to be admitted to a  hospital for treatment.  Quitting smoking is very important to prevent COPD from getting worse and to prevent exacerbations from happening as often. This information is not intended to replace advice given to you by your health care provider. Make sure you discuss any questions you have with your health care provider. Document Released: 02/17/2007 Document Revised: 05/27/2016 Document Reviewed: 05/27/2016 Elsevier Interactive Patient Education  2018 [employer]  

## 2017-07-28 ENCOUNTER — Encounter
Admission: RE | Admit: 2017-07-28 | Discharge: 2017-07-28 | Disposition: A | Discharge status: Home / Self Care | Payer: BLUE CROSS/BLUE SHIELD | Source: Ambulatory Visit | Attending: Otolaryngology | Admitting: Otolaryngology

## 2017-07-28 ENCOUNTER — Other Ambulatory Visit: Payer: Self-pay

## 2017-07-28 DIAGNOSIS — Z01812 Encounter for preprocedural laboratory examination: Secondary | ICD-10-CM | POA: Insufficient documentation

## 2017-07-28 HISTORY — DX: Nontoxic multinodular goiter: E04.2

## 2017-07-28 HISTORY — DX: Type 2 diabetes mellitus without complications: E11.9

## 2017-07-28 HISTORY — DX: Hypothyroidism, unspecified: E03.9

## 2017-07-28 LAB — CBC
HEMATOCRIT: 38.7 % (ref 35.0–47.0)
HEMOGLOBIN: 13.1 g/dL (ref 12.0–16.0)
MCH: 31.6 pg (ref 26.0–34.0)
MCHC: 33.8 g/dL (ref 32.0–36.0)
MCV: 93.6 fL (ref 80.0–100.0)
Platelets: 250 10*3/uL (ref 150–440)
RBC: 4.13 MIL/uL (ref 3.80–5.20)
RDW: 14 % (ref 11.5–14.5)
WBC: 7.5 10*3/uL (ref 3.6–11.0)

## 2017-07-28 LAB — BASIC METABOLIC PANEL
ANION GAP: 12 (ref 5–15)
BUN: 15 mg/dL (ref 6–20)
CO2: 24 mmol/L (ref 22–32)
Calcium: 10.3 mg/dL (ref 8.9–10.3)
Chloride: 102 mmol/L (ref 101–111)
Creatinine, Ser: 0.78 mg/dL (ref 0.44–1.00)
GLUCOSE: 105 mg/dL — AB (ref 65–99)
POTASSIUM: 3.7 mmol/L (ref 3.5–5.1)
SODIUM: 138 mmol/L (ref 135–145)

## 2017-07-28 LAB — HEMOGLOBIN A1C
Hgb A1c MFr Bld: 6.3 % — ABNORMAL HIGH (ref 4.8–5.6)
MEAN PLASMA GLUCOSE: 134.11 mg/dL

## 2017-07-28 NOTE — Patient Instructions (Signed)
Your procedure is scheduled on: Wednesday,  August 06, 2017 Report to Day Surgery on the 2nd [facility]. To find out your arrival time, please call [phone] between 1PM - 3PM on: Tuesday, August 05, 2017  REMEMBER: Instructions that are not followed completely may result in serious medical risk, up to and including death; or upon the discretion of your surgeon and anesthesiologist your surgery may need to be rescheduled.  Do not eat food after midnight the night before your procedure.  No gum chewing, lozengers or hard candies.  You may however, drink CLEAR liquids up to 2 hours before you are scheduled to arrive for your surgery. Do not drink anything within 2 hours of the start of your surgery.  Clear liquids include: - water  - apple juice without pulp - clear gatorade - black coffee or tea (Do NOT add anything to the coffee or tea) Do NOT drink anything that is not on this list.  No Alcohol for 24 hours before or after surgery.  No Smoking including e-cigarettes for 24 hours prior to surgery.  No chewable tobacco products for at least 6 hours prior to surgery.  No nicotine patches on the day of surgery.  On the morning of surgery brush your teeth with toothpaste and water, you may rinse your mouth with mouthwash if you wish. Do not swallow any toothpaste or mouthwash.  Notify your doctor if there is any change in your medical condition (cold, fever, infection).  Do not wear jewelry, make-up, hairpins, clips or nail polish.  Do not wear lotions, powders, or perfumes. You may wear deodorant.  Do not shave 48 hours prior to surgery.   Contacts and dentures may not be worn into surgery.  Do not bring valuables to the hospital, including drivers license, insurance or credit cards.  [facility] is not responsible for any belongings or valuables.   TAKE THESE MEDICATIONS THE MORNING OF SURGERY:  1.  Albuterol inhaler 2.  symbicort inhaler 3.   Levothyroxine 4.  spiriva inhaler  Use CHG Soap as directed on instruction sheet.  Use inhalers on the day of surgery and bring to the hospital.  Bring your C-PAP to the hospital with you in case you may have to spend the night.   NOW!  Stop Anti-inflammatories (NSAIDS) such as Advil, Aleve, Ibuprofen, Motrin, Naproxen, Naprosyn and Aspirin based products such as Excedrin, Goodys Powder, BC Powder. (May take Tylenol or Acetaminophen if needed.)  NOW!  Stop ANY OVER THE COUNTER supplements until after surgery. (May continue multivitamin.)  Wear comfortable clothing (specific to your surgery type) to the hospital.  Plan for stool softeners for home use.  If you are being admitted to the hospital overnight, leave your suitcase in the car. After surgery it may be brought to your room.  Please call [phone] if you have any questions about these instructions.

## 2017-07-29 NOTE — Pre-Procedure Instructions (Signed)
Requested H&P from Dr. Bennett. 

## 2017-07-29 NOTE — Pre-Procedure Instructions (Signed)
A1C results sent to Dr. Harle StanfordK. Linthavong for review.

## 2017-08-06 ENCOUNTER — Encounter
Admission: RE | Disposition: A | Discharge status: Home / Self Care | Payer: Self-pay | Source: Ambulatory Visit | Attending: Otolaryngology

## 2017-08-06 ENCOUNTER — Ambulatory Visit: Payer: BLUE CROSS/BLUE SHIELD | Admitting: Anesthesiology

## 2017-08-06 ENCOUNTER — Encounter: Payer: Self-pay | Admitting: *Deleted

## 2017-08-06 ENCOUNTER — Other Ambulatory Visit: Payer: Self-pay

## 2017-08-06 ENCOUNTER — Observation Stay
Admission: RE | Admit: 2017-08-06 | Discharge: 2017-08-07 | Disposition: A | Discharge status: Home / Self Care | Payer: BLUE CROSS/BLUE SHIELD | Source: Ambulatory Visit | Attending: Otolaryngology | Admitting: Otolaryngology

## 2017-08-06 DIAGNOSIS — E039 Hypothyroidism, unspecified: Secondary | ICD-10-CM | POA: Diagnosis not present

## 2017-08-06 DIAGNOSIS — I10 Essential (primary) hypertension: Secondary | ICD-10-CM | POA: Diagnosis not present

## 2017-08-06 DIAGNOSIS — E049 Nontoxic goiter, unspecified: Secondary | ICD-10-CM | POA: Diagnosis present

## 2017-08-06 DIAGNOSIS — E119 Type 2 diabetes mellitus without complications: Secondary | ICD-10-CM | POA: Diagnosis not present

## 2017-08-06 DIAGNOSIS — Z79899 Other long term (current) drug therapy: Secondary | ICD-10-CM | POA: Insufficient documentation

## 2017-08-06 DIAGNOSIS — J449 Chronic obstructive pulmonary disease, unspecified: Secondary | ICD-10-CM | POA: Insufficient documentation

## 2017-08-06 DIAGNOSIS — R490 Dysphonia: Secondary | ICD-10-CM | POA: Diagnosis not present

## 2017-08-06 DIAGNOSIS — Z885 Allergy status to narcotic agent status: Secondary | ICD-10-CM | POA: Insufficient documentation

## 2017-08-06 DIAGNOSIS — E063 Autoimmune thyroiditis: Secondary | ICD-10-CM | POA: Insufficient documentation

## 2017-08-06 DIAGNOSIS — E042 Nontoxic multinodular goiter: Principal | ICD-10-CM | POA: Insufficient documentation

## 2017-08-06 DIAGNOSIS — F17200 Nicotine dependence, unspecified, uncomplicated: Secondary | ICD-10-CM | POA: Insufficient documentation

## 2017-08-06 HISTORY — PX: THYROIDECTOMY: SHX17

## 2017-08-06 LAB — URINE DRUG SCREEN, QUALITATIVE (ARMC ONLY)
Amphetamines, Ur Screen: NOT DETECTED
BARBITURATES, UR SCREEN: NOT DETECTED
Benzodiazepine, Ur Scrn: NOT DETECTED
CANNABINOID 50 NG, UR ~~LOC~~: NOT DETECTED
COCAINE METABOLITE, UR ~~LOC~~: NOT DETECTED
MDMA (Ecstasy)Ur Screen: NOT DETECTED
Methadone Scn, Ur: NOT DETECTED
Opiate, Ur Screen: NOT DETECTED
PHENCYCLIDINE (PCP) UR S: NOT DETECTED
TRICYCLIC, UR SCREEN: NOT DETECTED

## 2017-08-06 LAB — CALCIUM: Calcium: 9 mg/dL (ref 8.9–10.3)

## 2017-08-06 LAB — GLUCOSE, CAPILLARY
GLUCOSE-CAPILLARY: 171 mg/dL — AB (ref 65–99)
Glucose-Capillary: 160 mg/dL — ABNORMAL HIGH (ref 65–99)

## 2017-08-06 SURGERY — THYROIDECTOMY
Anesthesia: General | Wound class: Clean

## 2017-08-06 MED ORDER — LIDOCAINE HCL (PF) 2 % IJ SOLN
INTRAMUSCULAR | Status: AC
  Filled 2017-08-06: qty 10

## 2017-08-06 MED ORDER — HYDROMORPHONE HCL 1 MG/ML IJ SOLN
INTRAMUSCULAR | Status: AC
  Filled 2017-08-06: qty 1

## 2017-08-06 MED ORDER — ONDANSETRON HCL 4 MG PO TABS: 4.0000 mg | ORAL_TABLET | ORAL | Status: DC | PRN

## 2017-08-06 MED ORDER — GLYCOPYRROLATE 0.2 MG/ML IJ SOLN
INTRAMUSCULAR | Status: DC | PRN
  Administered 2017-08-06: 0.1 mg via INTRAVENOUS

## 2017-08-06 MED ORDER — PROPOFOL 10 MG/ML IV BOLUS
INTRAVENOUS | Status: DC | PRN
  Administered 2017-08-06: 200 mg via INTRAVENOUS

## 2017-08-06 MED ORDER — OXYCODONE HCL 5 MG PO TABS: 5.0000 mg | ORAL_TABLET | Freq: Once | ORAL | Status: DC | PRN

## 2017-08-06 MED ORDER — ONDANSETRON HCL 4 MG/2ML IJ SOLN
INTRAMUSCULAR | Status: DC | PRN
  Administered 2017-08-06 (×2): 4 mg via INTRAVENOUS

## 2017-08-06 MED ORDER — PHENYLEPHRINE HCL 10 MG/ML IJ SOLN
INTRAMUSCULAR | Status: AC
  Filled 2017-08-06: qty 1

## 2017-08-06 MED ORDER — PHENYLEPHRINE HCL 10 MG/ML IJ SOLN
INTRAMUSCULAR | Status: DC | PRN
  Administered 2017-08-06 (×2): 100 ug via INTRAVENOUS

## 2017-08-06 MED ORDER — OXYCODONE HCL 5 MG/5ML PO SOLN: 5.0000 mg | Freq: Once | ORAL | Status: DC | PRN

## 2017-08-06 MED ORDER — LACTATED RINGERS IV SOLN
INTRAVENOUS | Status: DC | PRN
  Administered 2017-08-06 (×2): via INTRAVENOUS

## 2017-08-06 MED ORDER — SODIUM CHLORIDE 0.9 % IV SOLN
INTRAVENOUS | Status: DC | PRN
  Administered 2017-08-06: 50 ug/min via INTRAVENOUS

## 2017-08-06 MED ORDER — BACITRACIN ZINC 500 UNIT/GM EX OINT
1.0000 "application " | TOPICAL_OINTMENT | Freq: Three times a day (TID) | CUTANEOUS | Status: DC
  Administered 2017-08-06 (×2): 1 via TOPICAL
  Filled 2017-08-06: qty 28.35

## 2017-08-06 MED ORDER — ROCURONIUM BROMIDE 50 MG/5ML IV SOLN
INTRAVENOUS | Status: AC
  Filled 2017-08-06: qty 1

## 2017-08-06 MED ORDER — MEPERIDINE HCL 50 MG/ML IJ SOLN: 6.2500 mg | INTRAMUSCULAR | Status: DC | PRN

## 2017-08-06 MED ORDER — ONDANSETRON HCL 4 MG/2ML IJ SOLN
INTRAMUSCULAR | Status: AC
  Filled 2017-08-06: qty 2

## 2017-08-06 MED ORDER — KCL IN DEXTROSE-NACL 20-5-0.45 MEQ/L-%-% IV SOLN
INTRAVENOUS | Status: DC
  Administered 2017-08-06 – 2017-08-07 (×2): via INTRAVENOUS
  Filled 2017-08-06 (×4): qty 1000

## 2017-08-06 MED ORDER — MIDAZOLAM HCL 2 MG/2ML IJ SOLN
INTRAMUSCULAR | Status: AC
  Filled 2017-08-06: qty 2

## 2017-08-06 MED ORDER — FAMOTIDINE 20 MG PO TABS
20.0000 mg | ORAL_TABLET | Freq: Once | ORAL | Status: AC
  Administered 2017-08-06: 20 mg via ORAL

## 2017-08-06 MED ORDER — LIDOCAINE-EPINEPHRINE (PF) 1 %-1:200000 IJ SOLN
INTRAMUSCULAR | Status: AC
  Filled 2017-08-06: qty 30

## 2017-08-06 MED ORDER — FAMOTIDINE 20 MG PO TABS
ORAL_TABLET | ORAL | Status: AC
  Administered 2017-08-06: 20 mg via ORAL
  Filled 2017-08-06: qty 1

## 2017-08-06 MED ORDER — REMIFENTANIL HCL 1 MG IV SOLR
INTRAVENOUS | Status: AC
  Filled 2017-08-06: qty 2000

## 2017-08-06 MED ORDER — LIDOCAINE HCL (CARDIAC) 20 MG/ML IV SOLN
INTRAVENOUS | Status: DC | PRN
  Administered 2017-08-06: 50 mg via INTRAVENOUS

## 2017-08-06 MED ORDER — SUCCINYLCHOLINE CHLORIDE 20 MG/ML IJ SOLN
INTRAMUSCULAR | Status: DC | PRN
  Administered 2017-08-06: 80 mg via INTRAVENOUS

## 2017-08-06 MED ORDER — ACETAMINOPHEN 10 MG/ML IV SOLN
INTRAVENOUS | Status: AC
  Filled 2017-08-06: qty 100

## 2017-08-06 MED ORDER — FENTANYL CITRATE (PF) 100 MCG/2ML IJ SOLN
25.0000 ug | INTRAMUSCULAR | Status: DC | PRN
  Administered 2017-08-06 (×4): 25 ug via INTRAVENOUS

## 2017-08-06 MED ORDER — PROPOFOL 10 MG/ML IV BOLUS: INTRAVENOUS | Status: DC | PRN

## 2017-08-06 MED ORDER — DEXAMETHASONE SODIUM PHOSPHATE 10 MG/ML IJ SOLN
INTRAMUSCULAR | Status: DC | PRN
  Administered 2017-08-06: 10 mg via INTRAVENOUS

## 2017-08-06 MED ORDER — HYDROCODONE-ACETAMINOPHEN 5-325 MG PO TABS
1.0000 | ORAL_TABLET | ORAL | Status: DC | PRN
  Administered 2017-08-06 (×2): 1 via ORAL
  Administered 2017-08-07 (×2): 2 via ORAL
  Filled 2017-08-06: qty 1
  Filled 2017-08-06 (×2): qty 2
  Filled 2017-08-06: qty 1

## 2017-08-06 MED ORDER — PROPOFOL 10 MG/ML IV BOLUS
INTRAVENOUS | Status: AC
  Filled 2017-08-06: qty 20

## 2017-08-06 MED ORDER — FENTANYL CITRATE (PF) 100 MCG/2ML IJ SOLN
INTRAMUSCULAR | Status: AC
  Administered 2017-08-06: 25 ug via INTRAVENOUS
  Filled 2017-08-06: qty 2

## 2017-08-06 MED ORDER — SODIUM CHLORIDE 0.9 % IV SOLN
INTRAVENOUS | Status: DC
  Administered 2017-08-06: 07:00:00 via INTRAVENOUS

## 2017-08-06 MED ORDER — BACITRACIN ZINC 500 UNIT/GM EX OINT
TOPICAL_OINTMENT | CUTANEOUS | Status: AC
  Filled 2017-08-06: qty 28.35

## 2017-08-06 MED ORDER — MIDAZOLAM HCL 2 MG/2ML IJ SOLN
INTRAMUSCULAR | Status: DC | PRN
  Administered 2017-08-06: 2 mg via INTRAVENOUS

## 2017-08-06 MED ORDER — ONDANSETRON HCL 4 MG/2ML IJ SOLN: 4.0000 mg | INTRAMUSCULAR | Status: DC | PRN

## 2017-08-06 MED ORDER — HYDROMORPHONE HCL 1 MG/ML IJ SOLN
INTRAMUSCULAR | Status: DC | PRN
  Administered 2017-08-06 (×2): 1 mg via INTRAVENOUS

## 2017-08-06 MED ORDER — MORPHINE SULFATE (PF) 2 MG/ML IV SOLN
2.0000 mg | INTRAVENOUS | Status: DC | PRN
  Administered 2017-08-06 (×3): 2 mg via INTRAVENOUS
  Filled 2017-08-06 (×3): qty 1

## 2017-08-06 MED ORDER — GLYCOPYRROLATE 0.2 MG/ML IJ SOLN
INTRAMUSCULAR | Status: AC
  Filled 2017-08-06: qty 1

## 2017-08-06 MED ORDER — DOCUSATE SODIUM 100 MG PO CAPS
100.0000 mg | ORAL_CAPSULE | Freq: Two times a day (BID) | ORAL | Status: DC
  Administered 2017-08-06 – 2017-08-07 (×3): 100 mg via ORAL
  Filled 2017-08-06 (×3): qty 1

## 2017-08-06 MED ORDER — ACETAMINOPHEN 10 MG/ML IV SOLN
INTRAVENOUS | Status: DC | PRN
  Administered 2017-08-06: 1000 mg via INTRAVENOUS

## 2017-08-06 MED ORDER — ROCURONIUM BROMIDE 100 MG/10ML IV SOLN
INTRAVENOUS | Status: DC | PRN
  Administered 2017-08-06: 10 mg via INTRAVENOUS

## 2017-08-06 MED ORDER — DEXAMETHASONE SODIUM PHOSPHATE 10 MG/ML IJ SOLN
INTRAMUSCULAR | Status: AC
  Filled 2017-08-06: qty 1

## 2017-08-06 MED ORDER — PROMETHAZINE HCL 25 MG/ML IJ SOLN: 6.2500 mg | INTRAMUSCULAR | Status: DC | PRN

## 2017-08-06 MED ORDER — PROPOFOL 500 MG/50ML IV EMUL
INTRAVENOUS | Status: AC
  Filled 2017-08-06: qty 50

## 2017-08-06 MED ORDER — CALCIUM CARBONATE-VITAMIN D 500-200 MG-UNIT PO TABS
2.0000 | ORAL_TABLET | Freq: Two times a day (BID) | ORAL | Status: DC
  Administered 2017-08-06 – 2017-08-07 (×2): 2 via ORAL
  Filled 2017-08-06 (×2): qty 2

## 2017-08-06 MED ORDER — SUCCINYLCHOLINE CHLORIDE 20 MG/ML IJ SOLN
INTRAMUSCULAR | Status: AC
  Filled 2017-08-06: qty 1

## 2017-08-06 MED ORDER — PROPOFOL 500 MG/50ML IV EMUL
INTRAVENOUS | Status: DC | PRN
  Administered 2017-08-06: 100 ug/kg/min via INTRAVENOUS

## 2017-08-06 MED ORDER — SODIUM CHLORIDE 0.9 % IV SOLN
INTRAVENOUS | Status: DC | PRN
  Administered 2017-08-06: .1 ug/kg/min via INTRAVENOUS

## 2017-08-06 MED ORDER — LIDOCAINE-EPINEPHRINE (PF) 1 %-1:200000 IJ SOLN
INTRAMUSCULAR | Status: DC | PRN
  Administered 2017-08-06: 4 mL

## 2017-08-06 SURGICAL SUPPLY — 40 items
BLADE SURG 15 STRL LF DISP TIS (BLADE) ×1 IMPLANT
BLADE SURG 15 STRL SS (BLADE) ×2
CANISTER SUCT 1200ML W/VALVE (MISCELLANEOUS) ×3 IMPLANT
CORD BIP STRL DISP 12FT (MISCELLANEOUS) ×3 IMPLANT
DRAIN TLS ROUND 10FR (DRAIN) IMPLANT
DRAPE MAG INST 16X20 L/F (DRAPES) ×3 IMPLANT
DRSG TEGADERM 2-3/8X2-3/4 SM (GAUZE/BANDAGES/DRESSINGS) ×3 IMPLANT
ELECT LARYNGEAL 6/7 (MISCELLANEOUS) ×3
ELECT LARYNGEAL 8/9 (MISCELLANEOUS)
ELECT REM PT RETURN 9FT ADLT (ELECTROSURGICAL) ×3
ELECTRODE LARYNGEAL 6/7 (MISCELLANEOUS) ×1 IMPLANT
ELECTRODE LARYNGEAL 8/9 (MISCELLANEOUS) IMPLANT
ELECTRODE REM PT RTRN 9FT ADLT (ELECTROSURGICAL) ×1 IMPLANT
FORCEPS JEWEL BIP 4-3/4 STR (INSTRUMENTS) ×3 IMPLANT
GAUZE SPONGE 4X4 12PLY STRL (GAUZE/BANDAGES/DRESSINGS) ×3 IMPLANT
GAUZE SPONGE 4X4 16PLY XRAY LF (GAUZE/BANDAGES/DRESSINGS) ×3 IMPLANT
GLOVE BIO SURGEON STRL SZ7.5 (GLOVE) ×12 IMPLANT
GLOVE BIOGEL PI IND STRL 6.5 (GLOVE) ×2 IMPLANT
GLOVE BIOGEL PI INDICATOR 6.5 (GLOVE) ×4
GOWN STRL REUS W/ TWL LRG LVL3 (GOWN DISPOSABLE) ×4 IMPLANT
GOWN STRL REUS W/TWL LRG LVL3 (GOWN DISPOSABLE) ×8
HARMONIC SCALPEL FOCUS (MISCELLANEOUS) ×2 IMPLANT
HEMOSTAT SURGICEL 2X3 (HEMOSTASIS) ×3 IMPLANT
HOOK STAY BLUNT/RETRACTOR 5M (MISCELLANEOUS) IMPLANT
JACKSON PRATT 7MM (INSTRUMENTS) ×6 IMPLANT
KIT TURNOVER KIT A (KITS) ×3 IMPLANT
LABEL OR SOLS (LABEL) ×3 IMPLANT
NS IRRIG 500ML POUR BTL (IV SOLUTION) ×3 IMPLANT
PACK HEAD/NECK (MISCELLANEOUS) ×3 IMPLANT
PROBE NEUROSIGN BIPOL (MISCELLANEOUS) ×1 IMPLANT
PROBE NEUROSIGN BIPOLAR (MISCELLANEOUS) ×2
SHEARS HARMONIC 9CM CVD (BLADE) ×3 IMPLANT
SPONGE KITTNER 5P (MISCELLANEOUS) ×9 IMPLANT
SPONGE XRAY 4X4 16PLY STRL (MISCELLANEOUS) IMPLANT
SUT PROLENE 3 0 PS 2 (SUTURE) ×3 IMPLANT
SUT SILK 2 0 (SUTURE) ×2
SUT SILK 2 0 SH (SUTURE) ×6 IMPLANT
SUT SILK 2-0 18XBRD TIE 12 (SUTURE) ×1 IMPLANT
SUT VIC AB 4-0 RB1 18 (SUTURE) ×3 IMPLANT
SYSTEM CHEST DRAIN TLS 7FR (DRAIN) ×6 IMPLANT

## 2017-08-06 NOTE — Progress Notes (Signed)
Caitlin Waters, Caitlin Waters [birth year] Sandi MealyPaul S Deklynn Charlet, MD   SUBJECTIVE: This 57 y.o. year old female is status post THYROIDECTOMY. Doing well, just a little sore. No significant hoarseness.   Medications:  Current Facility-Administered Medications  Medication Dose Route Frequency Provider Last Rate Last Dose  . bacitracin ointment 1 application  1 application Topical Q8H Geanie LoganBennett, Giovoni Bunch, MD   1 application at 08/06/17 1423  . calcium-vitamin D (OSCAL WITH D) 500-200 MG-UNIT per tablet 2 tablet  2 tablet Oral BID Geanie LoganBennett, Alexcis Bicking, MD      . dextrose 5 % and 0.45 % NaCl with KCl 20 mEq/L infusion   Intravenous Continuous Geanie LoganBennett, Sheriff Rodenberg, MD 100 mL/hr at 08/06/17 1422    . docusate sodium (COLACE) capsule 100 mg  100 mg Oral BID Geanie LoganBennett, Jameria Bradway, MD   100 mg at 08/06/17 1635  . HYDROcodone-acetaminophen (NORCO/VICODIN) 5-325 MG per tablet 1-2 tablet  1-2 tablet Oral Q4H PRN Geanie LoganBennett, Renesmae Donahey, MD   1 tablet at 08/06/17 1636  . morphine 2 MG/ML injection 2-4 mg  2-4 mg Intravenous Q2H PRN Geanie LoganBennett, Marvette Schamp, MD   2 mg at 08/06/17 1431  . ondansetron (ZOFRAN) tablet 4 mg  4 mg Oral Q4H PRN Geanie LoganBennett, Deakin Lacek, MD       Or  . ondansetron [facility](ZOFRAN) injection 4 mg  4 mg Intravenous Q4H PRN Geanie LoganBennett, Tabetha Haraway, MD      .  Medications Prior to Admission  Medication Sig Dispense Refill  . acetaminophen (TYLENOL) 325 MG tablet Take 325 mg by mouth every 6 (six) hours as needed for moderate pain or headache.    . albuterol (PROVENTIL HFA;VENTOLIN HFA) 108 (90 Base) MCG/ACT inhaler Inhale 2 puffs by mouth every 4 hours as needed for wheezing, cough, and/or shortness of breath 1 Inhaler 0  . budesonide-formoterol (SYMBICORT) 80-4.5 MCG/ACT inhaler Inhale 2 puffs into the lungs 2 (two) times daily. 1 Inhaler 0  . levothyroxine (SYNTHROID, LEVOTHROID) 125 MCG tablet Take 125 mcg by mouth daily before breakfast.     . loratadine (CLARITIN) 10 MG tablet Take 10 mg by mouth daily.    Marland Kitchen. losartan (COZAAR) 25 MG tablet Take 25 mg by mouth daily.     . Multiple Vitamins-Minerals (MULTIVITAMIN PO) Take 1 tablet by mouth daily.    Marland Kitchen. tiotropium (SPIRIVA) 18 MCG inhalation capsule Place 1 capsule (18 mcg total) into inhaler and inhale daily. 30 capsule 0    OBJECTIVE:  PHYSICAL EXAM  Vitals: Blood pressure 129/81, pulse 90, temperature 98.1 F (36.7 C), resp. rate 16, weight 168 lb (76.2 kg), SpO2 96 %.. General: Well-developed, Well-nourished in no acute distress Mood: Mood and affect well adjusted, pleasant and cooperative. Orientation: Grossly alert and oriented. Vocal Quality: No hoarseness. Communicates verbally. Neck: Neck wound is clean, dry, intact. Minimal serosanguinous output from JP drains. No neck swelling Respiratory: Normal respiratory effort without labored breathing.  MEDICAL DECISION MAKING: Data Review:  Results for orders placed or performed during the hospital encounter of 08/06/17 (from the past 48 hour(s))  Urine Drug Screen, Qualitative ([facility] only)     Status: None   Collection Time: 08/06/17  6:03 AM  Result Value Ref Range   Tricyclic, Ur Screen NONE DETECTED NONE DETECTED   Amphetamines, Ur Screen NONE DETECTED NONE DETECTED   MDMA (Ecstasy)Ur Screen NONE DETECTED NONE DETECTED   Cocaine Metabolite,Ur [place] NONE DETECTED NONE DETECTED   Opiate, Ur Screen NONE DETECTED NONE DETECTED   Phencyclidine (PCP) Ur S NONE DETECTED NONE DETECTED   Cannabinoid 50  Ng, Ur [place] NONE DETECTED NONE DETECTED   Barbiturates, Ur Screen NONE DETECTED NONE DETECTED   Benzodiazepine, Ur Scrn NONE DETECTED NONE DETECTED   Methadone Scn, Ur NONE DETECTED NONE DETECTED    Comment: (NOTE) Tricyclics + metabolites, urine    Cutoff 1000 ng/mL Amphetamines + metabolites, urine  Cutoff 1000 ng/mL MDMA (Ecstasy), urine              Cutoff 500 ng/mL Cocaine Metabolite, urine          Cutoff 300 ng/mL Opiate + metabolites, urine        Cutoff 300 ng/mL Phencyclidine (PCP), urine         Cutoff 25 ng/mL Cannabinoid, urine                  Cutoff 50 ng/mL Barbiturates + metabolites, urine  Cutoff 200 ng/mL Benzodiazepine, urine              Cutoff 200 ng/mL Methadone, urine                   Cutoff 300 ng/mL The urine drug screen provides only a preliminary, unconfirmed analytical test result and should not be used for non-medical purposes. Clinical consideration and professional judgment should be applied to any positive drug screen result due to possible interfering substances. A more specific alternate chemical method must be used in order to obtain a confirmed analytical result. Gas chromatography / mass spectrometry (GC/MS) is the preferred confirmat ory method. Performed at [facility], [address] [place], [place] [address]   Glucose, capillary     Status: Abnormal   Collection Time: 08/06/17  6:11 AM  Result Value Ref Range   Glucose-Capillary 160 (H) 65 - 99 mg/dL  Glucose, capillary     Status: Abnormal   Collection Time: 08/06/17 10:33 AM  Result Value Ref Range   Glucose-Capillary 171 (H) 65 - 99 mg/dL  . No results found..   ASSESSMENT: status post total thyroidectomy, doing well  PLAN: May be able to remove drains in AM. Follow serum calcium. Patient may advance diet as tolerated   [name], MD 08/06/2017 5:04 PM

## 2017-08-06 NOTE — Anesthesia Post-op Follow-up Note (Signed)
Anesthesia QCDR form completed.        

## 2017-08-06 NOTE — Anesthesia Postprocedure Evaluation (Signed)
Anesthesia Post Note  Patient: Caitlin Waters  Procedure(s) Performed: THYROIDECTOMY (N/A )  Patient location during evaluation: PACU Anesthesia Type: General Level of consciousness: awake and alert and oriented Pain management: pain level controlled Vital Signs Assessment: post-procedure vital signs reviewed and stable Respiratory status: spontaneous breathing, nonlabored ventilation and respiratory function stable Cardiovascular status: blood pressure returned to baseline and stable Postop Assessment: no signs of nausea or vomiting Anesthetic complications: no     Last Vitals:  Vitals:   08/06/17 1205 08/06/17 1217  BP: 109/70 106/68  Pulse: 94 90  Resp: 14 14  Temp:    SpO2: 97% 97%    Last Pain:  Vitals:   08/06/17 1242  TempSrc:   PainSc: 5                  Minha Fulco

## 2017-08-06 NOTE — OR Nursing (Signed)
Patient resting now pain is less

## 2017-08-06 NOTE — Transfer of Care (Signed)
Immediate Anesthesia Transfer of Care Note  Patient: Caitlin Waters  Procedure(s) Performed: THYROIDECTOMY (N/A )  Patient Location: PACU  Anesthesia Type:General  Level of Consciousness: awake, alert , oriented and patient cooperative  Airway & Oxygen Therapy: Patient Spontanous Breathing and Patient connected to face mask oxygen  Post-op Assessment: Report given to RN, Post -op Vital signs reviewed and stable and Patient moving all extremities  Post vital signs: Reviewed and stable  Last Vitals:  Vitals Value Taken Time  BP    Temp 37.3 C 08/06/2017 10:25 AM  Pulse 110 08/06/2017 10:26 AM  Resp 13 08/06/2017 10:26 AM  SpO2 97 % 08/06/2017 10:26 AM  Vitals shown include unvalidated device data.  Last Pain:  Vitals:   08/06/17 1025  TempSrc:   PainSc: 0-No pain         Complications: No apparent anesthesia complications

## 2017-08-06 NOTE — Anesthesia Preprocedure Evaluation (Signed)
Anesthesia Evaluation  Patient identified by MRN, date of birth, ID band Patient awake    Reviewed: Allergy & Precautions, NPO status , Patient's Chart, lab work & pertinent test results  History of Anesthesia Complications Negative for: history of anesthetic complications  Airway Mallampati: II  TM Distance: >3 FB Neck ROM: Full    Dental  (+) Poor Dentition, Missing   Pulmonary neg sleep apnea, COPD,  COPD inhaler, Current Smoker,    breath sounds clear to auscultation- rhonchi (-) wheezing      Cardiovascular Exercise Tolerance: Good hypertension, Pt. on medications (-) CAD, (-) Past MI, (-) Cardiac Stents and (-) CABG  Rhythm:Regular Rate:Normal - Systolic murmurs and - Diastolic murmurs    Neuro/Psych negative neurological ROS  negative psych ROS   GI/Hepatic negative GI ROS, Neg liver ROS,   Endo/Other  diabetes (diet controlled)Hypothyroidism   Renal/GU negative Renal ROS     Musculoskeletal negative musculoskeletal ROS (+)   Abdominal (+) - obese,   Peds  Hematology negative hematology ROS (+)   Anesthesia Other Findings Past Medical History: No date: COPD (chronic obstructive pulmonary disease) (HCC) No date: Diabetes mellitus type 2, diet-controlled (HCC) No date: HTN (hypertension) No date: Hypothyroidism No date: Multinodular goiter No date: Thyroid disease No date: Tobacco abuse   Reproductive/Obstetrics                             Anesthesia Physical Anesthesia Plan  ASA: III  Anesthesia Plan: General   Post-op Pain Management:    Induction: Intravenous  PONV Risk Score and Plan: 1 and Ondansetron, Dexamethasone and Midazolam  Airway Management Planned: Oral ETT  Additional Equipment:   Intra-op Plan:   Post-operative Plan: Extubation in OR  Informed Consent: I have reviewed the patients History and Physical, chart, labs and discussed the procedure  including the risks, benefits and alternatives for the proposed anesthesia with the patient or authorized representative who has indicated his/her understanding and acceptance.   Dental advisory given  Plan Discussed with: CRNA and Anesthesiologist  Anesthesia Plan Comments:         Anesthesia Quick Evaluation

## 2017-08-06 NOTE — Op Note (Addendum)
08/06/2017  10:22 AM    Caitlin Waters  [id number]   Pre-Op Diagnosis:  THYROID GOITER  Post-op Diagnosis: thyroid goiter  Procedure:   Total Thyroidectomy  Surgeon:  Sandi MealyPaul S Braison Snoke   First Assistant: Linus Salmonshapman McQueen   Anesthesia:  General endotracheal  EBL:  50cc  Complications:  None  Findings: Large hypovascular nodular thyroid lobes   Procedure: After the patient was identified in holding and the procedure was reviewed.  The patient was taken to the operating room and with the patient in a comfortable supine position,  general endotracheal anesthesia was induced without difficulty.  A nerve monitor on the endotracheal tube was visualized to be between the cords at the time of intubation. A proper time-out was performed, confirming the operative site and procedure.  The patient was placed on a shoulder roll and position. Skin was injected with 1% lidocaine with epinephrine 1:200,000. The patient was then prepped and draped in the usual sterile fashion. A 15 blade was used to incise the skin carrying the incision down through the subcutaneous tissues. The platysma muscle was divided with the Bovie. The strap muscles were identified in the midline and divided in the midline, retracting them laterally. Small anterior jugular veins were either clamped and suture divided or divided with the Harmonic scalpel. The strap muscles were retracted laterally off of the capsule of the right lobe of the thyroid gland. This was then finger dissected to loosen loose attachments to the gland. Dissection proceeded superiorly, dissecting the superior pole of the gland. The superior pole vessels were divided with the Harmonic scalpel. The superior pole was dissected staying right on the capsule, taking care not to resect any associated parathyroid tissue. The superior pole was retracted inferiorly and dissection proceeded around the lateral aspect of the gland, dividing vascular attachments with the  Harmonic scalpel. The inferior pole was dissected, again, staying on the capsule to avoid the parathyroid gland, and the inferior pole vessels were divided with the Harmonic scalpel. Care was taken to divide all of these vessels right at the capsule of the gland. The gland was then retracted medially and delivered from the wound. Dissection along the trachea revealed the recurrent laryngeal nerve which stimulated properly. The gland was then dissected off of the trachea, dividing Berry's ligament with the nerve carefully protected.   Next the left lobe of the thyroid gland was dissected in the same fashion as described above. Once again the superior and inferior pole vessels were divided right at the capsule of the gland and a structure consistent with parathyroid tissue was identified superiorly. Retracting the gland medially the recurrent laryngeal nerve was identified and confirmed with the stimulator. This was then carefully protected as the gland was dissected away from the trachea and Berry's ligament divided. The entire was delivered and sent as a specimen with the superior pole of the left lobe marked with a stitch. Each recurrent nerve was stimulated to confirm integrity.  The wound was then irrigated and inspected for bleeding. #7 JP drains were placed on either side of the trachea with the drains coming out through the skin just below the wound. The drains were secured with 2-0 silk suture. Surgicel was placed on either side of the trachea to control minor oozing within the wound.  The strap muscles were then reapproximated with 4-0 Vicryl suture. The platysma and subcutaneous tissues were also closed with 4-0 Vicryl suture. The skin was closed with a 3-0 running subcuticular Prolene suture. The patient  was then returned to the anesthesiologist for awakening. The patient was awakened and taken to recovery room in good condition postoperatively.  The patient was then returned to the anesthesiologist  in good condition for awakening. The patient was awakened and taken to the recovery room in good condition.   Disposition:   PACU then transferred to floor  Plan: The patient is to be admitted for observation and monitoring of serum calcium, with potential discharge tomorrow if serum calcium is stable overnight.  [name] 08/06/2017 10:22 AM

## 2017-08-06 NOTE — Anesthesia Procedure Notes (Signed)
Procedure Name: Intubation Date/Time: 08/06/2017 7:40 AM Performed by: Sherol DadeMacMang, Brittni Hult H, CRNA Pre-anesthesia Checklist: Patient identified, Emergency Drugs available, Suction available, Patient being monitored and Timeout performed Patient Re-evaluated:Patient Re-evaluated prior to induction Oxygen Delivery Method: Circle system utilized Preoxygenation: Pre-oxygenation with 100% oxygen Induction Type: IV induction Ventilation: Mask ventilation without difficulty Laryngoscope Size: McGraph and 3 Grade View: Grade I Tube type: Oral Airway Equipment and Method: Stylet and Video-laryngoscopy Placement Confirmation: ETT inserted through vocal cords under direct vision,  positive ETCO2,  breath sounds checked- equal and bilateral and CO2 detector Secured at: 22 cm Tube secured with: Tape Dental Injury: Teeth and Oropharynx as per pre-operative assessment  Comments: McGrath utilized in order to ensure proper placement of monitoring tube (sticker placed around regular ETT)

## 2017-08-06 NOTE — H&P (Signed)
History and physical reviewed and will be scanned in later. No change in medical status reported by the patient or family, appears stable for surgery. All questions regarding the procedure answered, and patient (or family if a child) expressed understanding of the procedure. Will proceed with total thyroidectomy  Caitlin MealyPaul S [name] @TODAY @

## 2017-08-07 DIAGNOSIS — E042 Nontoxic multinodular goiter: Secondary | ICD-10-CM | POA: Diagnosis not present

## 2017-08-07 LAB — CALCIUM: CALCIUM: 9.5 mg/dL (ref 8.9–10.3)

## 2017-08-07 LAB — GLUCOSE, CAPILLARY: GLUCOSE-CAPILLARY: 194 mg/dL — AB (ref 65–99)

## 2017-08-07 MED ORDER — DOCUSATE SODIUM 100 MG PO CAPS: 100.0000 mg | ORAL_CAPSULE | Freq: Two times a day (BID) | ORAL | 0 refills | Status: DC

## 2017-08-07 MED ORDER — HYDROCODONE-ACETAMINOPHEN 5-325 MG PO TABS: 1.0000 | ORAL_TABLET | ORAL | 0 refills | Status: DC | PRN

## 2017-08-07 MED ORDER — BACITRACIN ZINC 500 UNIT/GM EX OINT: 1.0000 "application " | TOPICAL_OINTMENT | Freq: Three times a day (TID) | CUTANEOUS | 0 refills | Status: DC

## 2017-08-07 MED ORDER — CALCIUM CARBONATE-VITAMIN D 500-200 MG-UNIT PO TABS: 2.0000 | ORAL_TABLET | Freq: Two times a day (BID) | ORAL | 1 refills | Status: DC

## 2017-08-07 NOTE — Final Progress Note (Signed)
Caitlin Waters, Zayli [id number] 1960/05/20 Caitlin MealyPaul S Amaal Dimartino, MD   SUBJECTIVE: This 57 y.o. year old female is status post THYROIDECTOMY. Doing well, advanced to soft diet, which she is tolerating. Good pain control  Medications:  Current Facility-Administered Medications  Medication Dose Route Frequency Provider Last Rate Last Dose  . bacitracin ointment 1 application  1 application Topical Q8H Geanie LoganBennett, Derika Eckles, MD   1 application at 08/06/17 2141  . calcium-vitamin D (OSCAL WITH D) 500-200 MG-UNIT per tablet 2 tablet  2 tablet Oral BID Geanie LoganBennett, Makaylie Dedeaux, MD   2 tablet at 08/06/17 2140  . dextrose 5 % and 0.45 % NaCl with KCl 20 mEq/L infusion   Intravenous Continuous Geanie LoganBennett, Catarino Vold, MD 100 mL/hr at 08/07/17 0015    . docusate sodium (COLACE) capsule 100 mg  100 mg Oral BID Geanie LoganBennett, Demaree Liberto, MD   100 mg at 08/06/17 2140  . HYDROcodone-acetaminophen (NORCO/VICODIN) 5-325 MG per tablet 1-2 tablet  1-2 tablet Oral Q4H PRN Geanie LoganBennett, Albertina Leise, MD   2 tablet at 08/07/17 0607  . morphine 2 MG/ML injection 2-4 mg  2-4 mg Intravenous Q2H PRN Geanie LoganBennett, Niki Payment, MD   2 mg at 08/06/17 2141  . ondansetron (ZOFRAN) tablet 4 mg  4 mg Oral Q4H PRN Geanie LoganBennett, Shaquille Janes, MD       Or  . ondansetron [employer](ZOFRAN) injection 4 mg  4 mg Intravenous Q4H PRN Geanie LoganBennett, Donevin Sainsbury, MD      .  Medications Prior to Admission  Medication Sig Dispense Refill  . acetaminophen (TYLENOL) 325 MG tablet Take 325 mg by mouth every 6 (six) hours as needed for moderate pain or headache.    . albuterol (PROVENTIL HFA;VENTOLIN HFA) 108 (90 Base) MCG/ACT inhaler Inhale 2 puffs by mouth every 4 hours as needed for wheezing, cough, and/or shortness of breath 1 Inhaler 0  . budesonide-formoterol (SYMBICORT) 80-4.5 MCG/ACT inhaler Inhale 2 puffs into the lungs 2 (two) times daily. 1 Inhaler 0  . levothyroxine (SYNTHROID, LEVOTHROID) 125 MCG tablet Take 125 mcg by mouth daily before breakfast.     . loratadine (CLARITIN) 10 MG tablet Take 10 mg by mouth daily.    Marland Kitchen. losartan (COZAAR)  25 MG tablet Take 25 mg by mouth daily.    . Multiple Vitamins-Minerals (MULTIVITAMIN PO) Take 1 tablet by mouth daily.    Marland Kitchen. tiotropium (SPIRIVA) 18 MCG inhalation capsule Place 1 capsule (18 mcg total) into inhaler and inhale daily. 30 capsule 0    OBJECTIVE:  PHYSICAL EXAM  Vitals: Blood pressure 126/78, pulse 90, temperature 97.9 F (36.6 C), temperature source Oral, resp. rate 18, weight 168 lb (76.2 kg), SpO2 100 %.. General: Well-developed, Well-nourished in no acute distress Mood: Mood and affect well adjusted, pleasant and cooperative. Orientation: Grossly alert and oriented. Vocal Quality: No hoarseness. Communicates verbally. head and Face: NCAT. No facial asymmetry. No visible skin lesions. No significant facial scars. No tenderness with sinus percussion. Facial strength normal and symmetric. Neck: Neck wound C/D/I. No hematoma, just some mild edema. Drains removed with serosanguinous discharge.  Respiratory: Normal respiratory effort without labored breathing.  MEDICAL DECISION MAKING: Data Review:  Results for orders placed or performed during the hospital encounter of 08/06/17 (from the past 48 hour(s))  Urine Drug Screen, Qualitative ([facility] only)     Status: None   Collection Time: 08/06/17  6:03 AM  Result Value Ref Range   Tricyclic, Ur Screen NONE DETECTED NONE DETECTED   Amphetamines, Ur Screen NONE DETECTED NONE DETECTED   MDMA (Ecstasy)Ur Screen NONE DETECTED  NONE DETECTED   Cocaine Metabolite,Ur [place] NONE DETECTED NONE DETECTED   Opiate, Ur Screen NONE DETECTED NONE DETECTED   Phencyclidine (PCP) Ur S NONE DETECTED NONE DETECTED   Cannabinoid 50 Ng, Ur [place] NONE DETECTED NONE DETECTED   Barbiturates, Ur Screen NONE DETECTED NONE DETECTED   Benzodiazepine, Ur Scrn NONE DETECTED NONE DETECTED   Methadone Scn, Ur NONE DETECTED NONE DETECTED    Comment: (NOTE) Tricyclics + metabolites, urine    Cutoff 1000 ng/mL Amphetamines + metabolites, urine  Cutoff 1000  ng/mL MDMA (Ecstasy), urine              Cutoff 500 ng/mL Cocaine Metabolite, urine          Cutoff 300 ng/mL Opiate + metabolites, urine        Cutoff 300 ng/mL Phencyclidine (PCP), urine         Cutoff 25 ng/mL Cannabinoid, urine                 Cutoff 50 ng/mL Barbiturates + metabolites, urine  Cutoff 200 ng/mL Benzodiazepine, urine              Cutoff 200 ng/mL Methadone, urine                   Cutoff 300 ng/mL The urine drug screen provides only a preliminary, unconfirmed analytical test result and should not be used for non-medical purposes. Clinical consideration and professional judgment should be applied to any positive drug screen result due to possible interfering substances. A more specific alternate chemical method must be used in order to obtain a confirmed analytical result. Gas chromatography / mass spectrometry (GC/MS) is the preferred confirmat ory method. Performed at [facility], [address] [place], [place] [address]   Glucose, capillary     Status: Abnormal   Collection Time: 08/06/17  6:11 AM  Result Value Ref Range   Glucose-Capillary 160 (H) 65 - 99 mg/dL  Glucose, capillary     Status: Abnormal   Collection Time: 08/06/17 10:33 AM  Result Value Ref Range   Glucose-Capillary 171 (H) 65 - 99 mg/dL  Calcium     Status: None   Collection Time: 08/06/17  4:28 PM  Result Value Ref Range   Calcium 9.0 8.9 - 10.3 mg/dL    Comment: Performed at [facility], [address] [place], [place] [address]  Glucose, capillary     Status: Abnormal   Collection Time: 08/07/17  1:03 AM  Result Value Ref Range   Glucose-Capillary 194 (H) 65 - 99 mg/dL  Calcium     Status: None   Collection Time: 08/07/17  6:44 AM  Result Value Ref Range   Calcium 9.5 8.9 - 10.3 mg/dL    Comment: Performed at [facility], [address], [place], [place] [address]  . No results found..   ASSESSMENT: Doing well with stable calcium  overnight  PLAN: D/c home with Lortab for pain. F/u in 1 week   Caitlin Mealy, MD 08/07/2017 8:29 AM

## 2017-08-13 ENCOUNTER — Other Ambulatory Visit: Payer: Self-pay | Admitting: Family Medicine

## 2017-08-13 DIAGNOSIS — Z1231 Encounter for screening mammogram for malignant neoplasm of breast: Secondary | ICD-10-CM

## 2017-08-21 LAB — SURGICAL PATHOLOGY

## 2017-09-08 ENCOUNTER — Ambulatory Visit
Admission: RE | Admit: 2017-09-08 | Discharge: 2017-09-08 | Disposition: A | Discharge status: Home / Self Care | Payer: BLUE CROSS/BLUE SHIELD | Source: Ambulatory Visit | Attending: Family Medicine | Admitting: Family Medicine

## 2017-09-08 DIAGNOSIS — Z1231 Encounter for screening mammogram for malignant neoplasm of breast: Secondary | ICD-10-CM | POA: Insufficient documentation

## 2018-08-05 ENCOUNTER — Other Ambulatory Visit: Payer: Self-pay | Admitting: Family Medicine

## 2018-08-05 DIAGNOSIS — Z1231 Encounter for screening mammogram for malignant neoplasm of breast: Secondary | ICD-10-CM

## 2018-10-05 ENCOUNTER — Other Ambulatory Visit: Payer: Self-pay

## 2018-10-05 ENCOUNTER — Ambulatory Visit
Admission: RE | Admit: 2018-10-05 | Discharge: 2018-10-05 | Disposition: A | Discharge status: Home / Self Care | Payer: BLUE CROSS/BLUE SHIELD | Source: Ambulatory Visit | Attending: Family Medicine | Admitting: Family Medicine

## 2018-10-05 DIAGNOSIS — Z1231 Encounter for screening mammogram for malignant neoplasm of breast: Secondary | ICD-10-CM | POA: Insufficient documentation

## 2019-07-12 ENCOUNTER — Ambulatory Visit: Payer: BLUE CROSS/BLUE SHIELD | Attending: Internal Medicine

## 2019-07-12 DIAGNOSIS — Z23 Encounter for immunization: Secondary | ICD-10-CM | POA: Insufficient documentation

## 2019-07-12 NOTE — Progress Notes (Signed)
   Covid-19 Vaccination Clinic  Name:  Caitlin Waters    MRN: [id number] DOB: Oct 18, 1960  07/12/2019  [name] was observed post Covid-19 immunization for 15 minutes without incident. She was provided with Vaccine Information Sheet and instruction to access the V-Safe system.   [name] was instructed to call 911 with any severe reactions post vaccine: Marland Kitchen Difficulty breathing  . Swelling of face and throat  . A fast heartbeat  . A bad rash all over body  . Dizziness and weakness   Immunizations Administered    Name Date Dose VIS Date Route   Pfizer COVID-19 Vaccine 07/12/2019  9:17 AM 0.3 mL 04/16/2019 Intramuscular   Manufacturer: [employer], [employer]   Lot: [id number]   NDC: [address]-2

## 2019-08-03 ENCOUNTER — Ambulatory Visit: Payer: BLUE CROSS/BLUE SHIELD | Attending: Internal Medicine

## 2019-08-03 DIAGNOSIS — Z23 Encounter for immunization: Secondary | ICD-10-CM

## 2019-08-03 NOTE — Progress Notes (Signed)
   Covid-19 Vaccination Clinic  Name:  Caitlin Waters    MRN: [id number] DOB: 11/20/1960  08/03/2019  [name] was observed post Covid-19 immunization for 15 minutes without incident. She was provided with Vaccine Information Sheet and instruction to access the V-Safe system.   [name] was instructed to call 911 with any severe reactions post vaccine: Marland Kitchen Difficulty breathing  . Swelling of face and throat  . A fast heartbeat  . A bad rash all over body  . Dizziness and weakness   Immunizations Administered    Name Date Dose VIS Date Route   Pfizer COVID-19 Vaccine 08/03/2019  9:36 AM 0.3 mL 04/16/2019 Intramuscular   Manufacturer: [employer], [employer]   Lot: [id number]   NDC: [address]-4

## 2019-09-13 DIAGNOSIS — R232 Flushing: Secondary | ICD-10-CM | POA: Diagnosis not present

## 2019-09-13 DIAGNOSIS — E785 Hyperlipidemia, unspecified: Secondary | ICD-10-CM | POA: Diagnosis not present

## 2019-09-13 DIAGNOSIS — E1169 Type 2 diabetes mellitus with other specified complication: Secondary | ICD-10-CM | POA: Diagnosis not present

## 2019-09-13 DIAGNOSIS — R55 Syncope and collapse: Secondary | ICD-10-CM | POA: Diagnosis not present

## 2019-09-20 ENCOUNTER — Other Ambulatory Visit: Payer: Self-pay | Admitting: Family Medicine

## 2019-09-20 DIAGNOSIS — Z1231 Encounter for screening mammogram for malignant neoplasm of breast: Secondary | ICD-10-CM

## 2019-10-18 ENCOUNTER — Ambulatory Visit
Admission: RE | Admit: 2019-10-18 | Discharge: 2019-10-18 | Disposition: A | Discharge status: Home / Self Care | Payer: 59 | Source: Ambulatory Visit | Attending: Family Medicine | Admitting: Family Medicine

## 2019-10-18 DIAGNOSIS — Z1231 Encounter for screening mammogram for malignant neoplasm of breast: Secondary | ICD-10-CM | POA: Diagnosis not present

## 2019-11-04 DIAGNOSIS — E782 Mixed hyperlipidemia: Secondary | ICD-10-CM | POA: Diagnosis not present

## 2019-11-04 DIAGNOSIS — I10 Essential (primary) hypertension: Secondary | ICD-10-CM | POA: Diagnosis not present

## 2019-11-04 DIAGNOSIS — E119 Type 2 diabetes mellitus without complications: Secondary | ICD-10-CM | POA: Diagnosis not present

## 2019-11-04 DIAGNOSIS — E039 Hypothyroidism, unspecified: Secondary | ICD-10-CM | POA: Diagnosis not present

## 2019-11-04 DIAGNOSIS — E1169 Type 2 diabetes mellitus with other specified complication: Secondary | ICD-10-CM | POA: Diagnosis not present

## 2019-11-04 DIAGNOSIS — E785 Hyperlipidemia, unspecified: Secondary | ICD-10-CM | POA: Diagnosis not present

## 2019-11-04 DIAGNOSIS — E890 Postprocedural hypothyroidism: Secondary | ICD-10-CM | POA: Insufficient documentation

## 2020-03-06 ENCOUNTER — Ambulatory Visit: Payer: 59 | Attending: Internal Medicine

## 2020-03-06 DIAGNOSIS — Z23 Encounter for immunization: Secondary | ICD-10-CM

## 2020-03-06 NOTE — Progress Notes (Signed)
   Covid-19 Vaccination Clinic  Name:  Caitlin Waters    MRN: [id number] DOB: 14-May-1960  03/06/2020  Caitlin Waters was observed post Covid-19 immunization for 15 minutes without incident. She was provided with Vaccine Information Sheet and instruction to access the V-Safe system.   Caitlin Waters was instructed to call 911 with any severe reactions post vaccine: Marland Kitchen Difficulty breathing  . Swelling of face and throat  . A fast heartbeat  . A bad rash all over body  . Dizziness and weakness

## 2020-03-23 ENCOUNTER — Ambulatory Visit: Payer: 59 | Admitting: Podiatry

## 2020-04-17 ENCOUNTER — Encounter: Payer: Self-pay | Admitting: Podiatry

## 2020-04-17 ENCOUNTER — Other Ambulatory Visit: Payer: Self-pay

## 2020-04-17 ENCOUNTER — Ambulatory Visit: Payer: 59 | Admitting: Podiatry

## 2020-04-17 DIAGNOSIS — B351 Tinea unguium: Secondary | ICD-10-CM

## 2020-04-17 DIAGNOSIS — M79675 Pain in left toe(s): Secondary | ICD-10-CM

## 2020-04-17 DIAGNOSIS — M79674 Pain in right toe(s): Secondary | ICD-10-CM | POA: Insufficient documentation

## 2020-04-17 DIAGNOSIS — E119 Type 2 diabetes mellitus without complications: Secondary | ICD-10-CM

## 2020-04-17 NOTE — Progress Notes (Signed)
Complaint:  Visit Type: Patient presents  to my office for continued preventative foot care services. Complaint: Patient states" my nails have grown long and thick and become painful to walk and wear shoes" Patient has been diagnosed with DM with no foot complications. The patient presents for preventative foot care services.  Podiatric Exam: Vascular: dorsalis pedis and posterior tibial pulses are palpable bilateral. Capillary return is immediate. Temperature gradient is WNL. Skin turgor WNL  Sensorium: Normal Semmes Weinstein monofilament test. Normal tactile sensation bilaterally. Nail Exam: Pt has thick disfigured discolored nails with subungual debris noted bilateral entire nail hallux through fifth toenails.  Pincer nails right foot. Ulcer Exam: There is no evidence of ulcer or pre-ulcerative changes or infection. Orthopedic Exam: Muscle tone and strength are WNL. No limitations in general ROM. No crepitus or effusions noted. Foot type and digits show no abnormalities. Exostosis midfoot right. Skin: No Porokeratosis. No infection or ulcers  Diagnosis:  Onychomycosis, , Pain in right toe, pain in left toes  Treatment & Plan Procedures and Treatment: Consent by patient was obtained for treatment procedures.  IE.     Debridement of mycotic and hypertrophic toenails, 1 through 5 bilateral and clearing of subungual debris. No ulceration, no infection noted. Discussed possible nail surgery in future. Return Visit-Office Procedure: Patient instructed to return to the office for a follow up visit 3 months for continued evaluation and treatment.    Helane Gunther DPM

## 2020-07-24 ENCOUNTER — Ambulatory Visit: Payer: 59 | Admitting: Podiatry

## 2020-08-28 ENCOUNTER — Ambulatory Visit: Payer: 59 | Admitting: Podiatry

## 2020-09-11 ENCOUNTER — Ambulatory Visit: Payer: 59 | Admitting: Podiatry

## 2020-09-11 ENCOUNTER — Other Ambulatory Visit: Payer: Self-pay

## 2020-09-11 DIAGNOSIS — Z8041 Family history of malignant neoplasm of ovary: Secondary | ICD-10-CM | POA: Insufficient documentation

## 2020-09-11 DIAGNOSIS — E039 Hypothyroidism, unspecified: Secondary | ICD-10-CM | POA: Insufficient documentation

## 2020-12-12 ENCOUNTER — Other Ambulatory Visit: Payer: Self-pay | Admitting: Family Medicine

## 2020-12-12 DIAGNOSIS — Z1231 Encounter for screening mammogram for malignant neoplasm of breast: Secondary | ICD-10-CM

## 2021-04-09 IMAGING — MG DIGITAL SCREENING BILAT W/ TOMO W/ CAD
8 series · 8 of 24 positions shown · non-contrast
Comparison: Previous exam(s).

CLINICAL DATA: Screening.

EXAM:
DIGITAL SCREENING BILATERAL MAMMOGRAM WITH TOMO AND CAD

[R CC synth-2D]
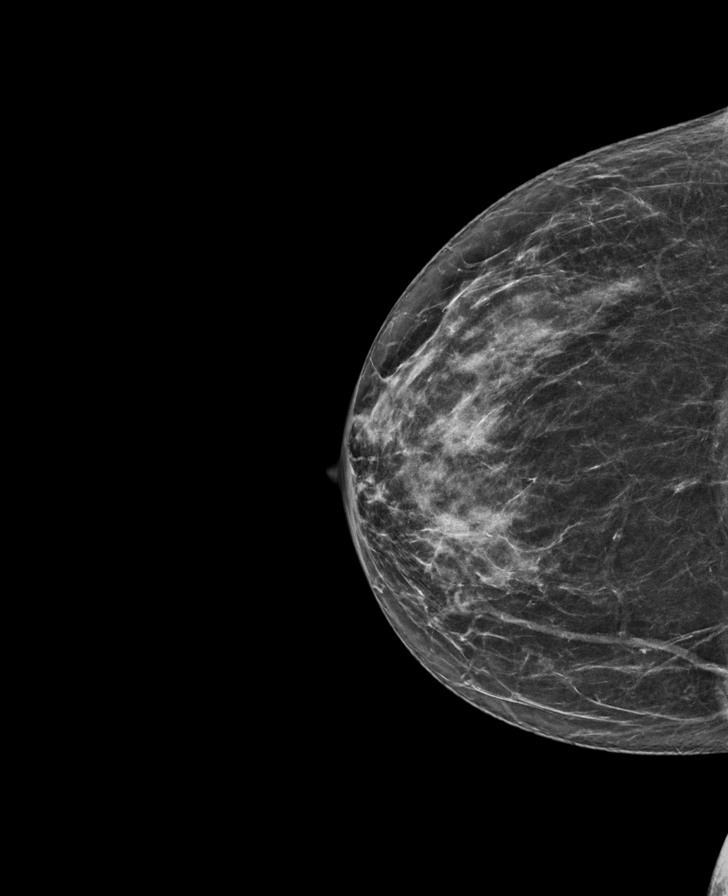

[L MLO synth-2D]
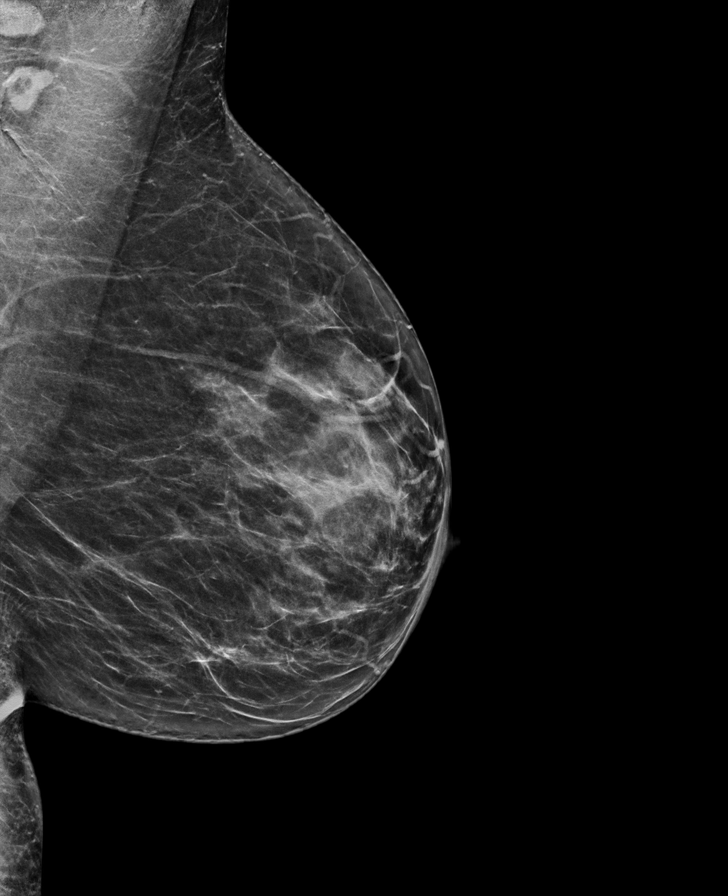

[R MLO synth-2D]
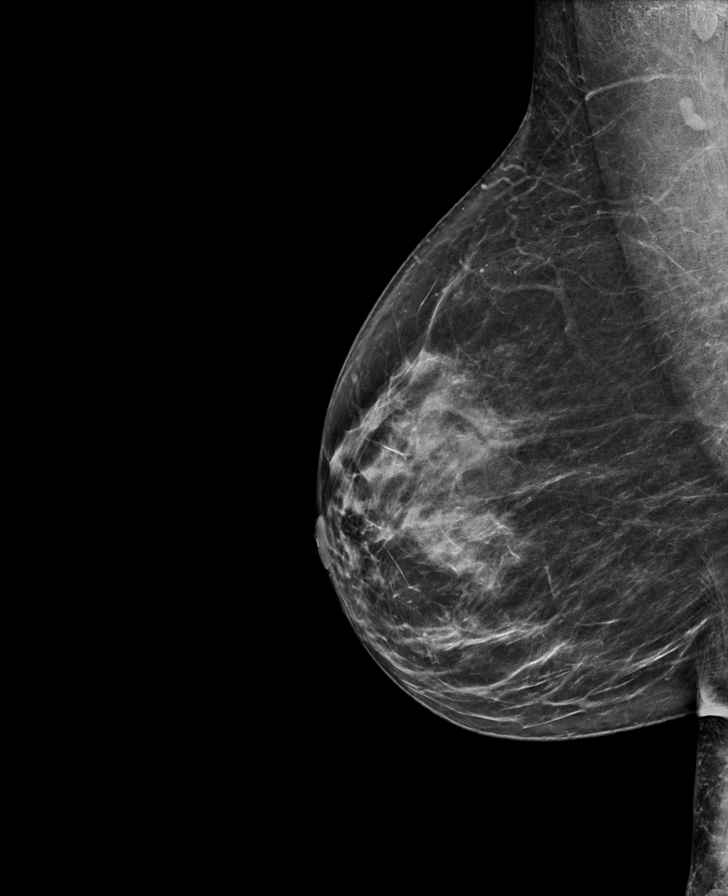

[L CC synth-2D]
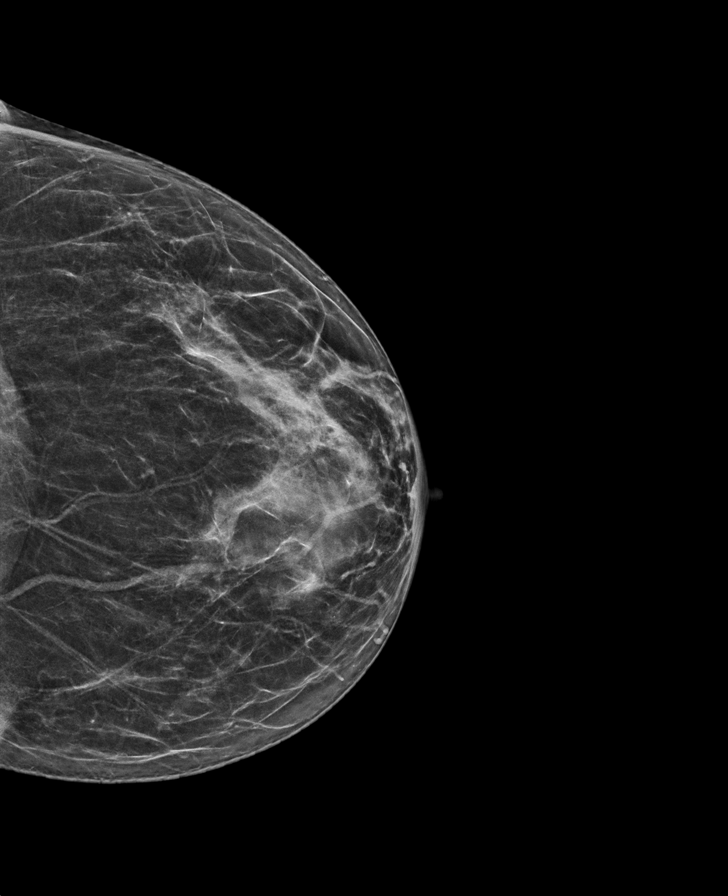

[L MLO tomo · tomo slice 36/71.0]
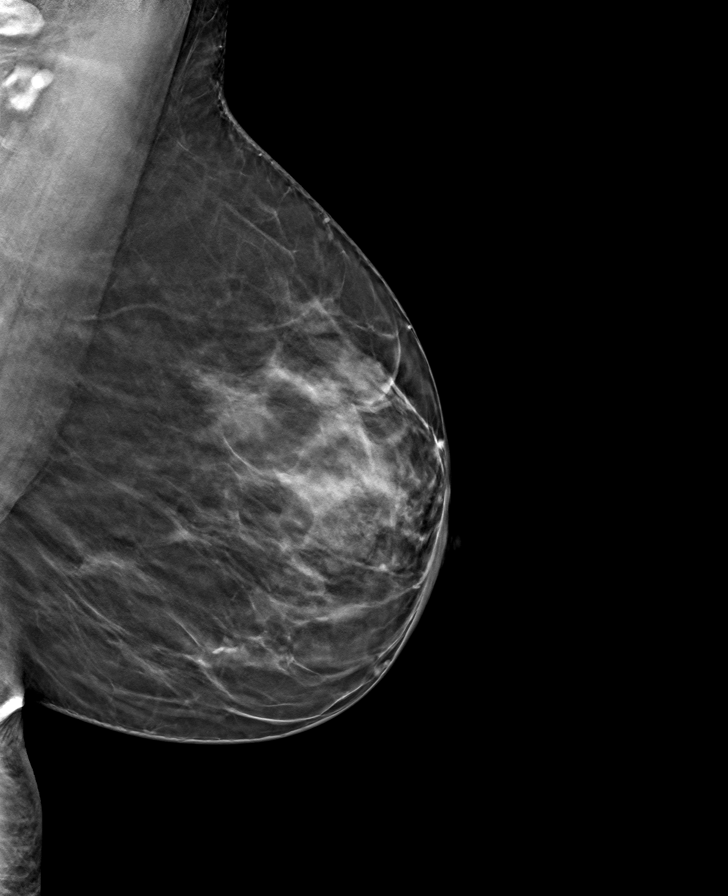

[R MLO tomo · tomo slice 37/72.0]
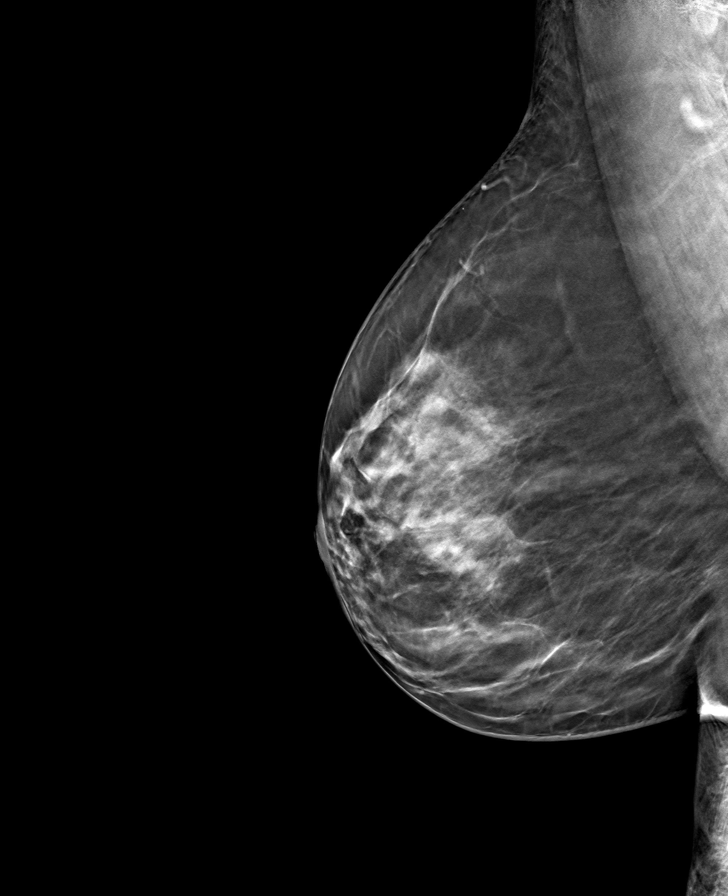

[L CC tomo · tomo slice 34/67.0]
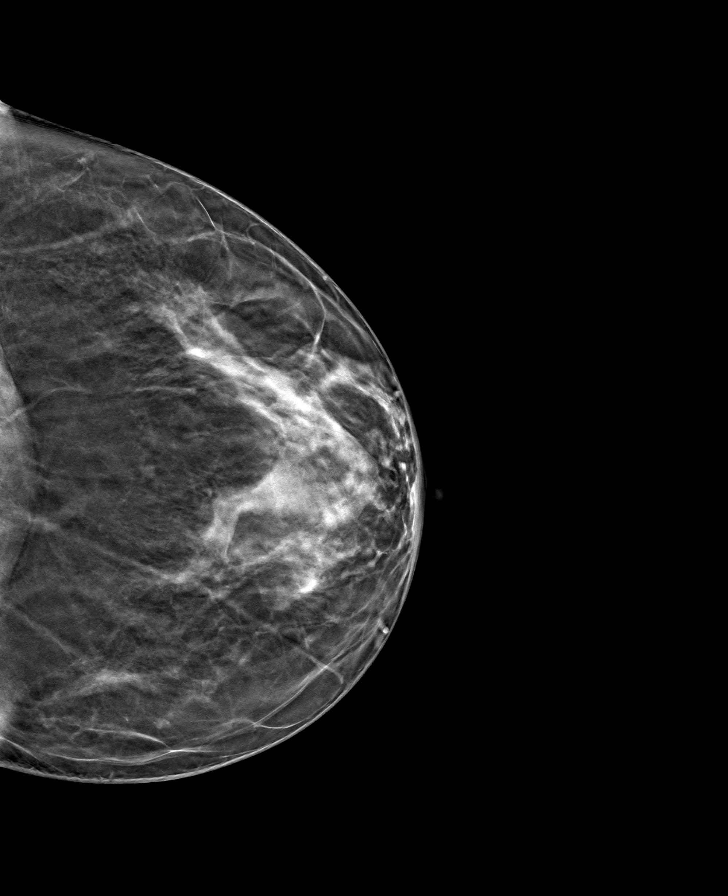

[R CC tomo · tomo slice 33/65.0]
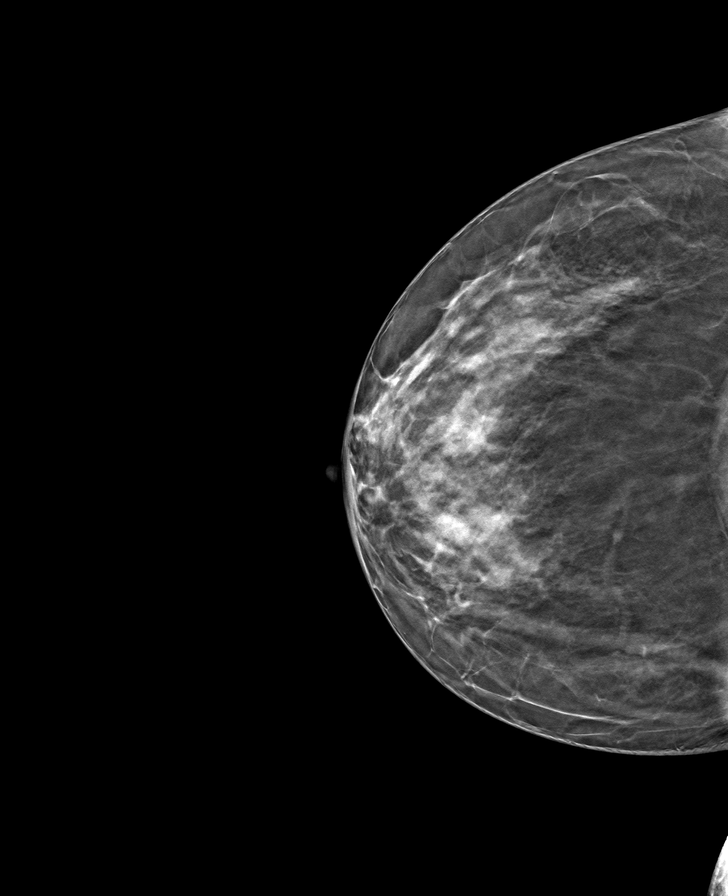

[8 of 24 positions shown; findings below may reference images not displayed]

ACR Breast Density Category c: The breast tissue is heterogeneously
dense, which may obscure small masses.
FINDINGS: There are no findings suspicious for malignancy. Images were
processed with CAD.
IMPRESSION: No mammographic evidence of malignancy. A result letter of this
screening mammogram will be mailed directly to the patient.

RECOMMENDATION:
Screening mammogram in one year. (Code:[id number])

BI-RADS CATEGORY  1: Negative.

## 2021-08-22 ENCOUNTER — Other Ambulatory Visit: Payer: Self-pay | Admitting: Otolaryngology

## 2021-08-22 DIAGNOSIS — R439 Unspecified disturbances of smell and taste: Secondary | ICD-10-CM

## 2021-09-04 ENCOUNTER — Ambulatory Visit
Admission: RE | Admit: 2021-09-04 | Discharge: 2021-09-04 | Disposition: A | Discharge status: Home / Self Care | Payer: 59 | Source: Ambulatory Visit | Attending: Otolaryngology | Admitting: Otolaryngology

## 2021-09-04 DIAGNOSIS — R439 Unspecified disturbances of smell and taste: Secondary | ICD-10-CM

## 2021-09-04 MED ORDER — GADOBENATE DIMEGLUMINE 529 MG/ML IV SOLN: 15.0000 mL | Freq: Once | INTRAVENOUS | Status: DC | PRN

## 2021-09-24 ENCOUNTER — Other Ambulatory Visit: Payer: 59

## 2021-10-04 ENCOUNTER — Other Ambulatory Visit: Payer: 59

## 2021-10-09 ENCOUNTER — Inpatient Hospital Stay: Admission: RE | Admit: 2021-10-09 | Payer: 59 | Source: Ambulatory Visit

## 2021-11-27 ENCOUNTER — Inpatient Hospital Stay: Admission: RE | Admit: 2021-11-27 | Payer: 59 | Source: Ambulatory Visit

## 2021-12-18 ENCOUNTER — Other Ambulatory Visit: Payer: 59

## 2022-03-07 ENCOUNTER — Other Ambulatory Visit: Payer: 59

## 2023-02-14 ENCOUNTER — Encounter: Payer: Self-pay | Admitting: *Deleted

## 2023-02-17 ENCOUNTER — Encounter: Payer: Self-pay | Admitting: *Deleted

## 2023-02-25 ENCOUNTER — Ambulatory Visit: Admission: RE | Admit: 2023-02-25 | Payer: 59 | Source: Home / Self Care | Admitting: *Deleted

## 2023-02-25 HISTORY — DX: Panic disorder (episodic paroxysmal anxiety): F41.0

## 2023-02-25 HISTORY — DX: Hyperlipidemia, unspecified: E78.5

## 2023-02-25 SURGERY — COLONOSCOPY WITH PROPOFOL
Anesthesia: General

## 2023-04-23 ENCOUNTER — Other Ambulatory Visit: Payer: Self-pay | Admitting: Specialist

## 2023-04-23 DIAGNOSIS — F17210 Nicotine dependence, cigarettes, uncomplicated: Secondary | ICD-10-CM

## 2023-04-28 ENCOUNTER — Ambulatory Visit
Admission: RE | Admit: 2023-04-28 | Discharge: 2023-04-28 | Disposition: A | Discharge status: Home / Self Care | Payer: 59 | Source: Ambulatory Visit | Attending: Specialist | Admitting: Specialist

## 2023-04-28 DIAGNOSIS — F17210 Nicotine dependence, cigarettes, uncomplicated: Secondary | ICD-10-CM

## 2023-05-20 ENCOUNTER — Ambulatory Visit: Payer: 59

## 2023-05-20 DIAGNOSIS — Z860100 Personal history of colon polyps, unspecified: Secondary | ICD-10-CM | POA: Diagnosis not present

## 2023-05-20 DIAGNOSIS — Z09 Encounter for follow-up examination after completed treatment for conditions other than malignant neoplasm: Secondary | ICD-10-CM | POA: Diagnosis present

## 2023-05-20 DIAGNOSIS — K640 First degree hemorrhoids: Secondary | ICD-10-CM | POA: Diagnosis not present

## 2024-05-03 ENCOUNTER — Other Ambulatory Visit: Payer: Self-pay | Admitting: Specialist

## 2024-05-03 ENCOUNTER — Encounter: Payer: Self-pay | Admitting: Specialist

## 2024-05-03 DIAGNOSIS — F17210 Nicotine dependence, cigarettes, uncomplicated: Secondary | ICD-10-CM

## 2024-05-10 ENCOUNTER — Inpatient Hospital Stay: Admission: RE | Admit: 2024-05-10 | Source: Ambulatory Visit
